# Patient Record
Sex: Female | Born: 1985 | Race: Black or African American | Hispanic: No | Marital: Single | Smoking: Never smoker
Health system: Southern US, Community
[De-identification: ages and names within clinical notes are randomized; demographics above are authoritative.]

## PROBLEM LIST (undated history)

## (undated) ENCOUNTER — Inpatient Hospital Stay (HOSPITAL_COMMUNITY): Payer: Self-pay

## (undated) HISTORY — PX: ABSCESS DRAINAGE: SHX1119

---
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
## 2004-02-28 ENCOUNTER — Encounter: Admission: RE | Admit: 2004-02-28 | Discharge: 2004-02-28 | Payer: Self-pay | Admitting: Family Medicine

## 2004-06-04 ENCOUNTER — Encounter: Admission: RE | Admit: 2004-06-04 | Discharge: 2004-06-04 | Payer: Self-pay | Admitting: Family Medicine

## 2004-08-29 ENCOUNTER — Ambulatory Visit: Payer: Self-pay | Admitting: Family Medicine

## 2004-11-29 ENCOUNTER — Ambulatory Visit: Payer: Self-pay | Admitting: Family Medicine

## 2005-03-26 ENCOUNTER — Ambulatory Visit: Payer: Self-pay | Admitting: Family Medicine

## 2005-04-09 ENCOUNTER — Ambulatory Visit: Payer: Self-pay | Admitting: Sports Medicine

## 2005-05-20 ENCOUNTER — Emergency Department (HOSPITAL_COMMUNITY): Admission: EM | Admit: 2005-05-20 | Discharge: 2005-05-20 | Payer: Self-pay | Admitting: Emergency Medicine

## 2005-05-30 ENCOUNTER — Ambulatory Visit: Payer: Self-pay | Admitting: Sports Medicine

## 2005-06-26 ENCOUNTER — Ambulatory Visit: Payer: Self-pay | Admitting: Family Medicine

## 2006-04-01 ENCOUNTER — Ambulatory Visit: Payer: Self-pay | Admitting: Obstetrics and Gynecology

## 2006-04-01 ENCOUNTER — Inpatient Hospital Stay (HOSPITAL_COMMUNITY): Admission: AD | Admit: 2006-04-01 | Discharge: 2006-04-01 | Payer: Self-pay | Admitting: Family Medicine

## 2006-04-22 ENCOUNTER — Inpatient Hospital Stay (HOSPITAL_COMMUNITY): Admission: AD | Admit: 2006-04-22 | Discharge: 2006-04-22 | Payer: Self-pay | Admitting: Obstetrics and Gynecology

## 2006-04-22 ENCOUNTER — Ambulatory Visit: Payer: Self-pay | Admitting: Obstetrics and Gynecology

## 2006-07-29 ENCOUNTER — Inpatient Hospital Stay (HOSPITAL_COMMUNITY): Admission: AD | Admit: 2006-07-29 | Discharge: 2006-08-01 | Payer: Self-pay | Admitting: Obstetrics

## 2007-06-15 IMAGING — US US OB COMP +14 WK
1 series · 13 of 28 positions shown · non-contrast
Comparison: none

CLINICAL DATA: No prenatal care.

[Series 1: us ob comp +14 wk · 0.29mm/px · 13 of 76 slices shown]
[im 3/76]
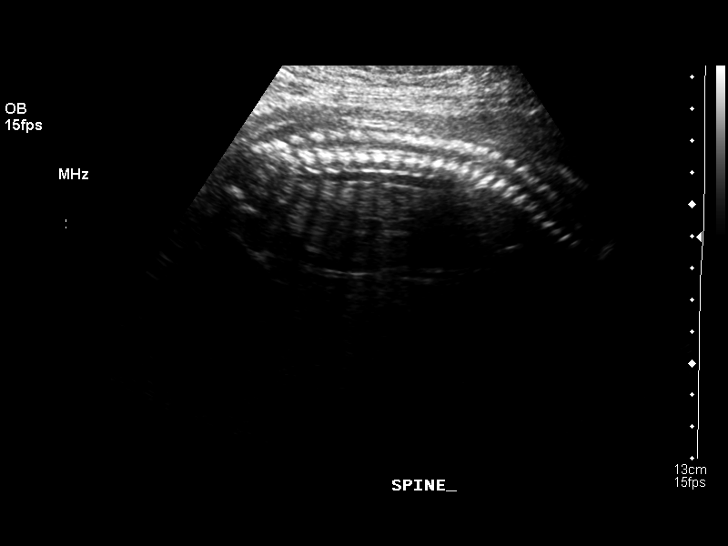
[im 9/76]
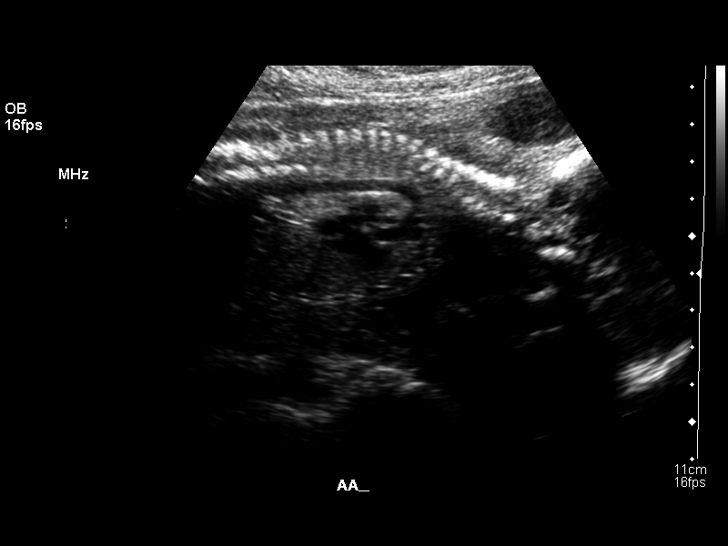
[im 14/76]
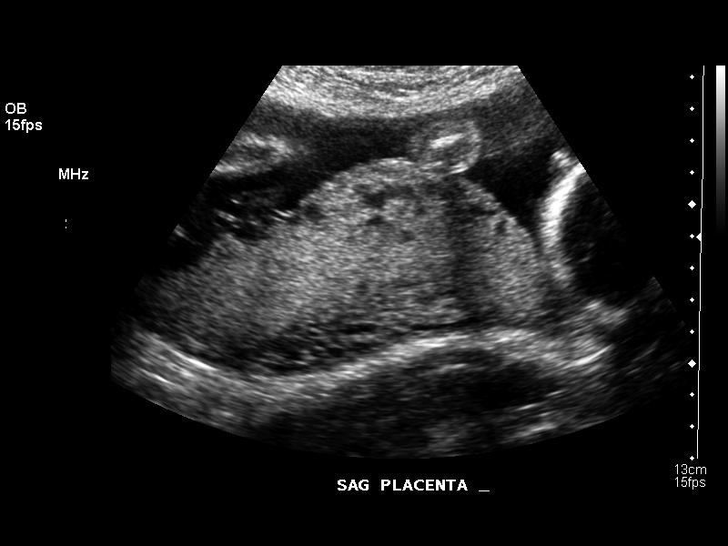
[im 20/76]
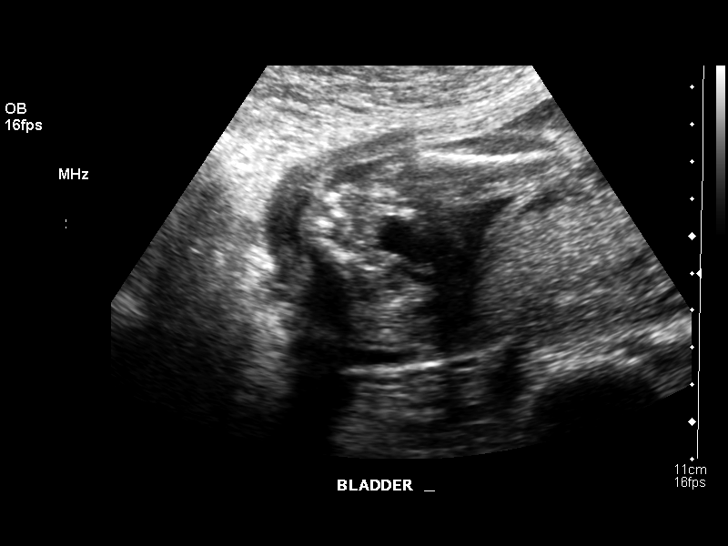
[im 26/76]
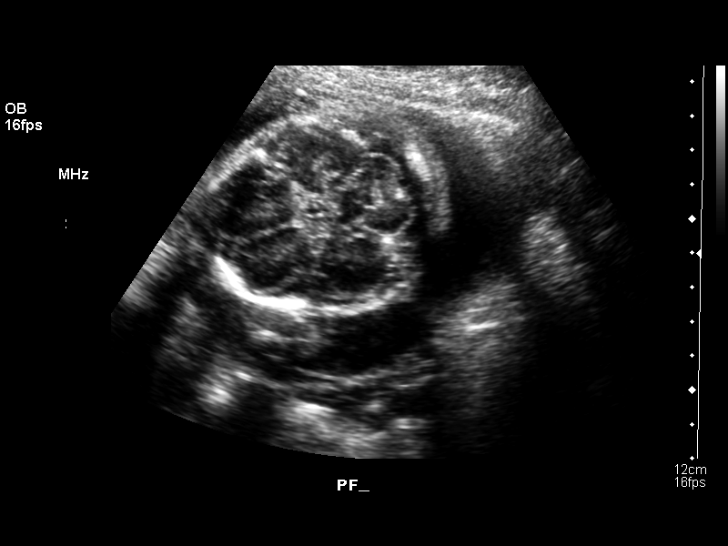
[im 31/76]
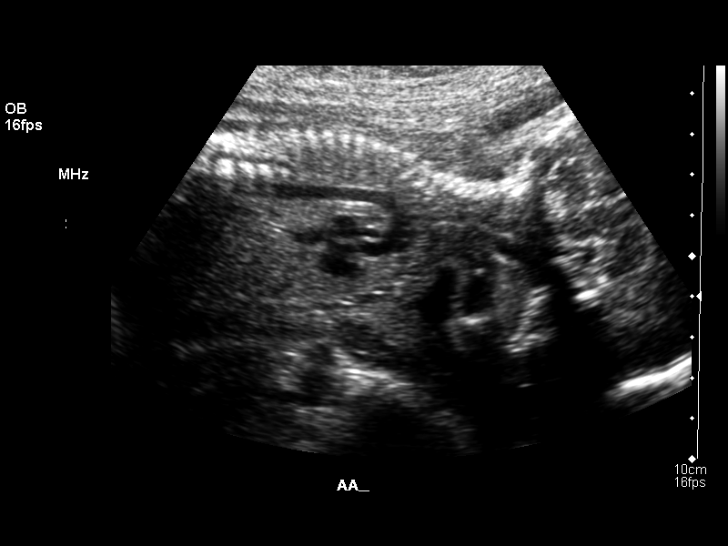
[im 39/76]
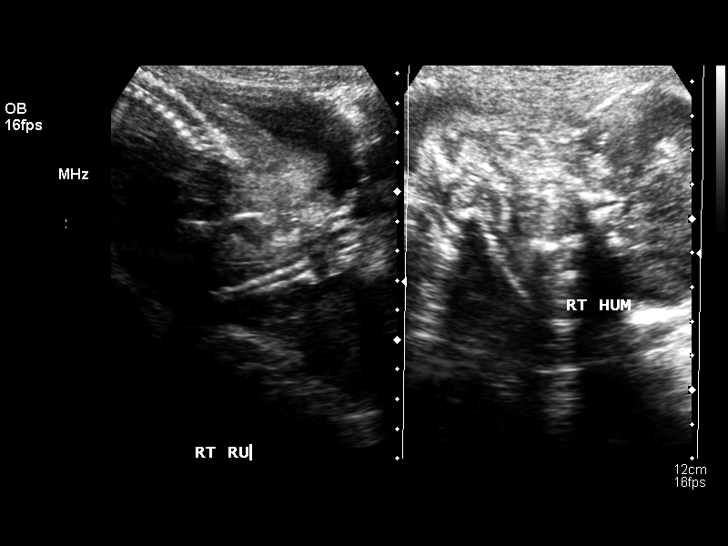
[im 45/76]
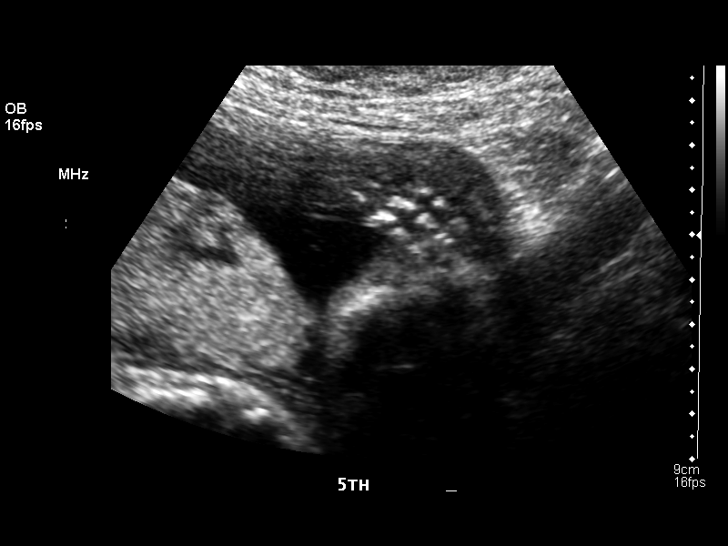
[im 51/76]
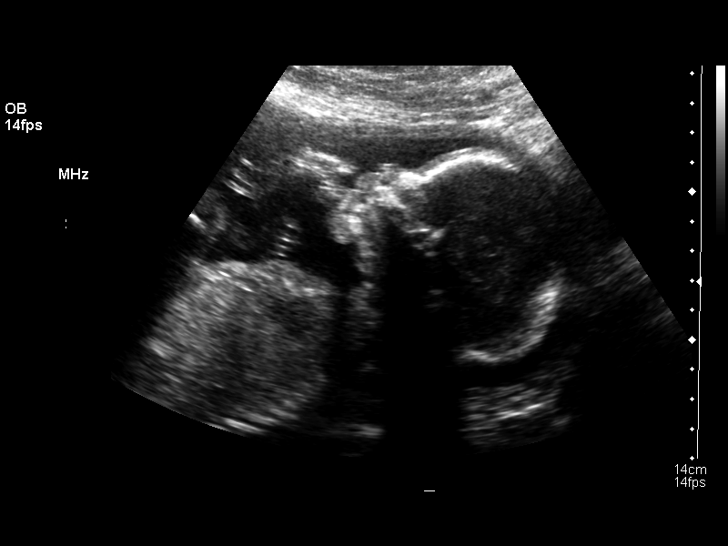
[im 56/76]
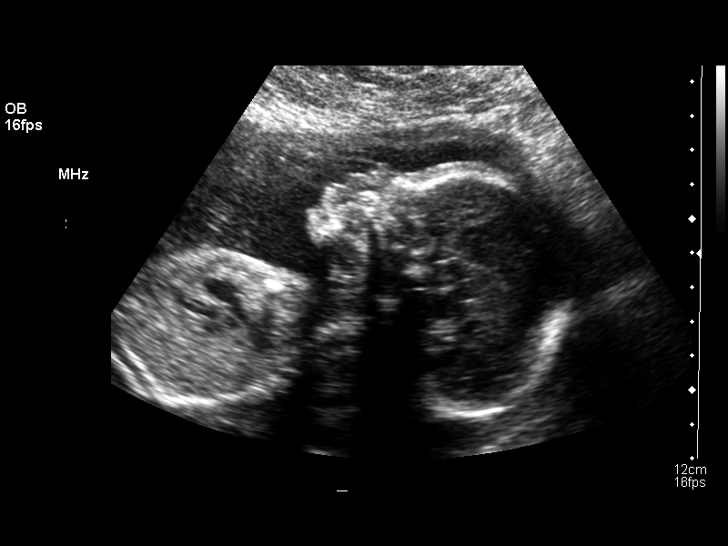
[im 62/76]
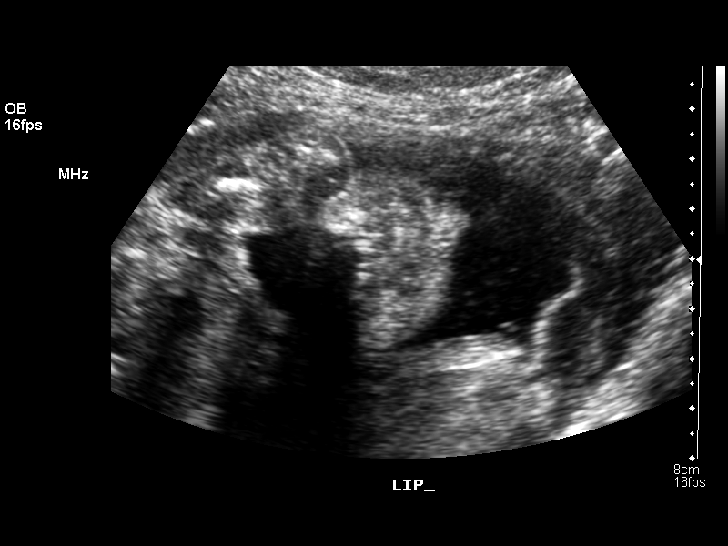
[im 67/76]
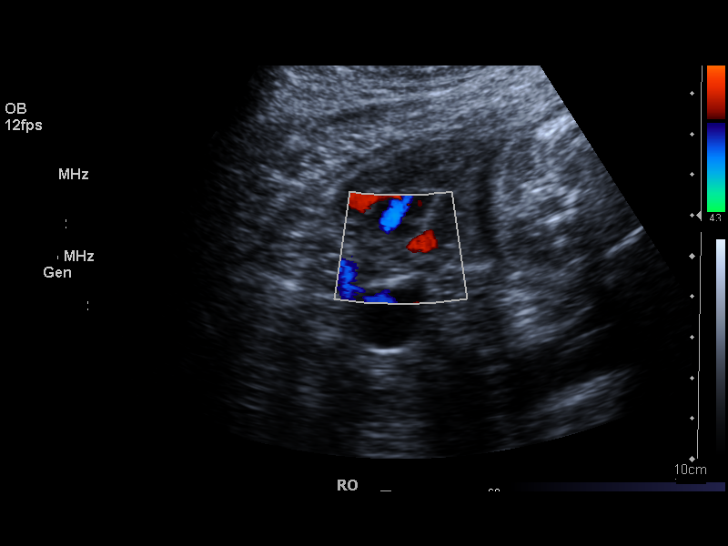
[im 73/76]
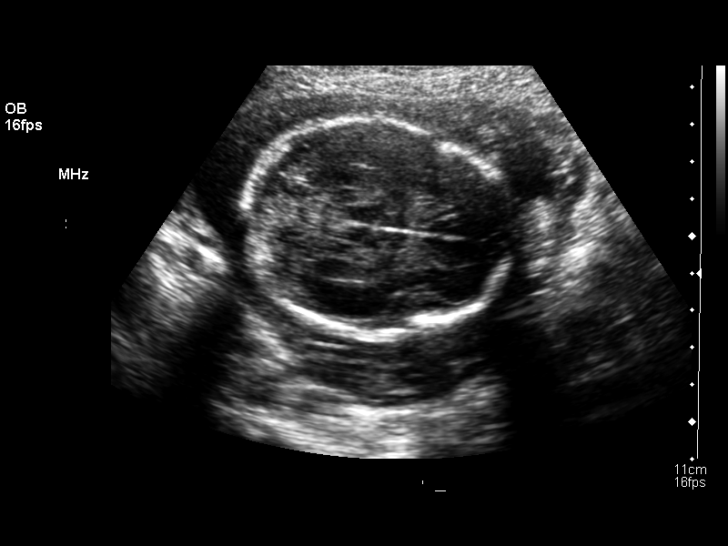

[13 of 28 positions shown; findings below may reference images not displayed]

OBSTETRICAL ULTRASOUND:
 Number of Fetuses: 1
 Heart Rate:  150
 Movement:  Yes
 Breathing:  No  
 Presentation:  Cephalic
 Placental Location:  Posterior
 Grade:  I
 Previa:  No
 Amniotic Fluid (Subjective):  Normal
 Amniotic Fluid (Objective):   4.9 cm Vertical pocket 

 FETAL BIOMETRY
 BPD:   5.8 cm   23 w 5 d
 HC:   22.0 cm   23 w 4 d
 AC:   17.4 cm   22 w 2 d
 FL:   4.1 cm   23 w 2 d

 MEAN GA:  23 w 2 d  US EDC: 07/27/06

 FETAL ANATOMY
 Lateral Ventricles:    Visualized 
 Thalami/CSP:      Visualized 
 Posterior Fossa:  Visualized 
 Nuchal Region:    Not visualized 
 Spine:      Visualized 
 4 Chamber Heart on Left:      Visualized 
 Stomach on Left:      Visualized 
 3 Vessel Cord:    Visualized 
 Cord Insertion site:    Visualized 
 Kidneys:  Visualized 
 Bladder:  Visualized 
 Extremities:      Visualized 

 ADDITIONAL ANATOMY VISUALIZED:  LVOT, RVOT, upper lip, orbits, profile, diaphragm, 5th digit, ductal arch, and aortic arch.  A nasal bone is visualized.

 MATERNAL UTERINE AND ADNEXAL FINDINGS
 Cervix: 3.5 cm Transabdominally.  Normal ovaries.
IMPRESSION: 1.  Single living intrauterine fetus in cephalic presentation with subjectively normal amniotic fluid volume.  Estimated mean gestational age by ultrasound today is 23 weeks 2 days with fairly good concordance of the fetal biometric parameters although the abdominal circumference lags about a week behind the other indicators.   Estimated ultrasound EDC is 07/27/06.
 2.  Visualized fetal anatomy is unremarkable.  
 3.  Follow-up ultrasound in four weeks would be helpful to insure appropriate interval growth.

## 2007-06-20 ENCOUNTER — Emergency Department (HOSPITAL_COMMUNITY): Admission: EM | Admit: 2007-06-20 | Discharge: 2007-06-20 | Payer: Self-pay | Admitting: Emergency Medicine

## 2008-05-01 ENCOUNTER — Inpatient Hospital Stay (HOSPITAL_COMMUNITY): Admission: AD | Admit: 2008-05-01 | Discharge: 2008-05-01 | Payer: Self-pay | Admitting: Gynecology

## 2008-05-22 ENCOUNTER — Emergency Department (HOSPITAL_COMMUNITY): Admission: EM | Admit: 2008-05-22 | Discharge: 2008-05-22 | Payer: Self-pay | Admitting: Emergency Medicine

## 2010-04-03 ENCOUNTER — Ambulatory Visit: Payer: Self-pay | Admitting: Nurse Practitioner

## 2010-04-03 ENCOUNTER — Inpatient Hospital Stay (HOSPITAL_COMMUNITY): Admission: AD | Admit: 2010-04-03 | Discharge: 2010-04-03 | Payer: Self-pay | Admitting: Obstetrics & Gynecology

## 2010-08-07 ENCOUNTER — Inpatient Hospital Stay (HOSPITAL_COMMUNITY): Admission: AD | Admit: 2010-08-07 | Discharge: 2010-08-09 | Payer: Self-pay | Admitting: Obstetrics

## 2011-01-09 LAB — CBC
HCT: 28.9 % — ABNORMAL LOW (ref 36.0–46.0)
Hemoglobin: 11.3 g/dL — ABNORMAL LOW (ref 12.0–15.0)
MCH: 25.6 pg — ABNORMAL LOW (ref 26.0–34.0)
MCHC: 32.4 g/dL (ref 30.0–36.0)
MCV: 78.7 fL (ref 78.0–100.0)
Platelets: 133 10*3/uL — ABNORMAL LOW (ref 150–400)
RDW: 14.7 % (ref 11.5–15.5)
RDW: 14.9 % (ref 11.5–15.5)
WBC: 13.2 10*3/uL — ABNORMAL HIGH (ref 4.0–10.5)

## 2011-01-09 LAB — RPR: RPR Ser Ql: NONREACTIVE

## 2011-01-14 LAB — TYPE AND SCREEN: Antibody Screen: NEGATIVE

## 2011-01-14 LAB — CBC
Hemoglobin: 11.5 g/dL — ABNORMAL LOW (ref 12.0–15.0)
MCHC: 33.4 g/dL (ref 30.0–36.0)
RDW: 13.4 % (ref 11.5–15.5)

## 2011-01-14 LAB — RPR: RPR Ser Ql: NONREACTIVE

## 2011-01-14 LAB — WET PREP, GENITAL
Clue Cells Wet Prep HPF POC: NONE SEEN
Trich, Wet Prep: NONE SEEN
Yeast Wet Prep HPF POC: NONE SEEN

## 2011-01-14 LAB — DIFFERENTIAL
Basophils Absolute: 0.1 10*3/uL (ref 0.0–0.1)
Basophils Relative: 0 % (ref 0–1)
Eosinophils Relative: 2 % (ref 0–5)
Monocytes Absolute: 0.6 10*3/uL (ref 0.1–1.0)
Neutro Abs: 8.3 10*3/uL — ABNORMAL HIGH (ref 1.7–7.7)

## 2011-03-15 NOTE — Discharge Summary (Signed)
Diane Sanders, BALSAM NO.:  [id number]   MEDICAL RECORD NO.:  [id number]                   PATIENT TYPE:  INP   LOCATION:  5711                                 FACILITY:  [facility]   PHYSICIAN:  Asencion Partridge, M.D.                  DATE OF BIRTH:  [birth year]   DATE OF ADMISSION:  05/22/2003  DATE OF DISCHARGE:  05/25/2003                                 DISCHARGE SUMMARY   DISCHARGE DIAGNOSES:  1. Bilateral breast abscesses.  2. Status post incision and drainage of bilateral breast abscesses.  3. Impetigo.   DISCHARGE MEDICATIONS:  1. Colace 100 mg 1 p.o. q.d.  2. Bactroban applied to affected areas t.i.d.  3. Keflex 500 mg 1 p.o. q.i.d. x 10 days.  4. Vicodin 5/500 mg 1-2 tablets p.o. q.4h. p.r.n. pain.   FOLLOW UP:  1. Tamika J. Lazarus Salines, M.D. at [facility] on Friday, May 17, 2003 at 3:20 p.m.  2. Sandria Bales. Ezzard Standing, M.D., at [facility] on Wednesday, June 01, 2003 at 10 a.m.   PROCEDURES:  1. Incision and drainage of bilateral breast abscesses.  2. Depo-Provera shot.   CONSULTATIONS:  Sandria Bales. Ezzard Standing, M.D. of [facility].   HISTORY OF PRESENT ILLNESS:  Upon admission, this 25 year old maybe three  and a half weeks postpartum, who had breast fed her infant for one week  stopped after developing soreness.  She was seen in the [facility] approximately two weeks after developing soreness and was given  Cephalexin for presumed mastitis and Vicodin for pain.  She took only two  doses of antibiotics because they made her itch.  She was using warm  compresses and on Saturday, the day before admission, her left breast began  draining.  So, she came to the emergency department on Sunday.   Dr. Sandria Bales. Ezzard Standing saw the patient in the emergency department and took her  to the emergency department for incision and drainage of her bilateral  breast abscesses.   LABORATORY DATA:  CBC with a  hemoglobin of 11.2, hematocrit 33.9, white  count of 27, platelets of 388,000.  She had 88% neutrophils, 7% lymphocytes,  5% monocytes, 0 eosinophils, and 0 basophils.  Chem-7 showed a sodium of  137, potassium of 3, chloride 96, bicarbonate 32, BUN 3, creatinine 0.6,  glucose is 76.  Calcium of 9.1.  Total bilirubin 0.5, alkaline phosphate  170.  SGOT 20, SGPT 10, total protein 7.9, albumin 2.6.  Culture of  abscesses show gram-positive cocci in clusters.  Blood cultures had no  growth as of discharge.   Upon discharge, her labs were CBC showing a hemoglobin of 10.6, hematocrit  32.7, white count of 8.3, platelets of 453,000.  Chem-7 showed a sodium of  140, potassium 4.3, chloride 107, bicarbonate 28, BUN less than  1,  creatinine 0.6, glucose of 90.  Calcium of 9.1.   HOSPITAL COURSE:  Problem 1:  BILATERAL BREASTS ABSCESS:  She was taken to  the operating room by Dr. Sandria Bales. Newman to perform an incision and  drainage bilaterally.  She was started on IV Ancef.  She was never febrile  but when it became clear that her infection was clearing she was switched to  p.o. antibiotics and she will be continued on these as an outpatient.   Problem 2:  IMPETIGO:  A small rash was noted on her right cheek.  It  crusted over with a yellow crust.  It was felt to be impetigo which will be  covered by the Keflex and she was also given Bactroban to apply to the  infected area.  She is discharged on May 25, 2003 in improved condition.  She will have home health that will schedule home visits to perform dressing  changes.      Ursula Beath, MD                     Asencion Partridge, M.D.    JT/MEDQ  D:  05/25/2003  T:  05/25/2003  Job:  [id number]   cc:   Sandria Bales. Ezzard Standing, M.D.  [address], [address]  [place]  [place] [address]  Fax: [phone]   Thomos Lemons. Lazarus Salines, M.D.  Fam. Med - Resident - [place], [place] [address]  Fax: [phone]

## 2011-03-15 NOTE — Consult Note (Signed)
NAME:  Diane Sanders, Diane Sanders                           ACCOUNT NO.:  [id number]   MEDICAL RECORD NO.:  [id number]                   PATIENT TYPE:  INP   LOCATION:  1828                                 FACILITY:  [facility]   PHYSICIAN:  Sandria Bales. Ezzard Standing, M.D.               DATE OF BIRTH:  11-01-1985   DATE OF CONSULTATION:  05/22/2003  DATE OF DISCHARGE:                                   CONSULTATION   REASON FOR CONSULTATION:  Bilateral breast abscesses.   HISTORY OF PRESENT ILLNESS:  This is an 25 year old black female who is  through the family practice teaching service and Tamika J. Lazarus Salines, M.D., who  she identifies as her primary caregiver.  About three-and-a-half weeks ago,  she had a boy delivered by vaginal delivery at [facility] called  __________.  She tried breastfeeding for about a week, but that really did  not last long.  She developed bilateral breast tender.  Apparently the child  developed Candida.  She took some of the medicine he was taking.  Her  breasts became increasing sore and tender.  She then saw one of the  residents at the family practice service on Thursday, May 19, 2003, and was  given Keflex and Vicodin.  Fortunately she has really only taken like a  couple of tablets of the Keflex.  She was worried about an allergy to Keflex  and did not take the medicine.  She then presented today to [facility] at about 1 p.m. with increasing breast pain.  Apparently she did  not talk to the family practice residents before she came and I was  consulted to see her.   ALLERGIES:  She states that she is allergic to IBUPROFEN.   MEDICATIONS:  Her current medications only include the Keflex and  hydrocodone that she has been on.   SOCIAL HISTORY:  She does have one other child who is almost 42 years old  this September.  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  Her boyfriend is in the room with her while I saw her.   REVIEW OF  SYSTEMS:  PULMONARY:  No history of pneumonia or tuberculosis.  CARDIAC:  No history of heart disease or chest pain.  GASTROINTESTINAL:  No  history of __________ disease or liver disease.  UROLOGIC:  No history of  kidney stones or kidney infections.   PHYSICAL EXAMINATION:  VITAL SIGNS:  Temperature 98.3 degrees, blood  pressure 119/69, pulse 101, respirations 16.  GENERAL APPEARANCE:  She is a well-nourished black female, alert and  cooperative on physical exam.  HEENT:  Unremarkable, although she has a little bit of a rash off the corner  of her right mouth that she is kind of blaming on a possible allergy to  medicine.  I wish that was true.  NECK:  Supple.  She  has no mass, no thyromegaly, and no lymphadenopathy.  LUNGS:  Clear to auscultation.  HEART:  Regular rate and rhythm without murmur or rub.  BREASTS:  She has a draining sinus tract at the 12 o'clock position of the  left breast, which is decompressed.  Her breasts are lumpy.  In her right  breast, she has a bulging area.  It is not really hot, but she had a bulging  over her right breast.   LABORATORY DATA:  She actually has a white blood cell count of 27,000,  hemoglobin 11, and hematocrit 33.   IMPRESSION:  1. Probable bilateral breast infections, although I am not really impressed     with how much swelling she has of the right breast as far as generalized     redness to the skin.  Her left breast clearly has an abscess that is     draining and needs to be open more broadly.  I talked to her and her     boyfriend about being taken to the operating room, cleaning up the left     breast, and probably pumping up the right breast for drainage.  These     will probably have to be locally care of and will take two to six weeks     to heal in.  I will put her on some IV antibiotics until this is all     better.  2. Postpartum three-and-a-half weeks from her second child.  I spoke to     Ursula Beath, M.D., who is on  call for family practice, to follow     her allow with me.  She has really been managed and treated at the family     practice office for the last two weeks.  I am just now seeing her for the     first time.                                               Sandria Bales. Ezzard Standing, M.D.    DHN/MEDQ  D:  05/22/2003  T:  05/22/2003  Job:  [id number]   cc:   Thomos Lemons. Lazarus Salines, M.D.  Fam. Med - Resident - [place], [place] [address]  Fax: [phone]   Ursula Beath, MD

## 2011-03-15 NOTE — Op Note (Signed)
   NAME:  Diane Sanders, Diane Sanders                           ACCOUNT NO.:  [id number]   MEDICAL RECORD NO.:  [id number]                   PATIENT TYPE:  INP   LOCATION:  5711                                 FACILITY:  [facility]   PHYSICIAN:  Sandria Bales. Ezzard Standing, M.D.               DATE OF BIRTH:  [birth year]   DATE OF PROCEDURE:  05/22/2003  DATE OF DISCHARGE:                                 OPERATIVE REPORT   PREOPERATIVE DIAGNOSIS:  Bilateral breast abscesses.   POSTOPERATIVE DIAGNOSIS:  Bilateral breast abscesses.   PROCEDURE:  Incision and drainage of right and left breast abscess.   SURGEON:  Sandria Bales. Ezzard Standing, M.D.   ANESTHESIA:  General.   ESTIMATED BLOOD LOSS:  50 mL.   DRAINS:  __________ packed both wounds open.   PROCEDURE:  [name] is an 25 year old black female who is 3 weeks  postpartum who presents to the ER with bilateral breast abscesses, treated  by the [facility] family practice service.  She had both of these incised  and drained.  I discussed with her the indications and potential  complications, including disfigurement, continued abscess, and pain.   The patient was brought into the operating room.  Both breasts were  ultrasounded.  The right breast __________. It was really sort of hard to  define.  It looked like a very good cavity on the right side.  The left side  was already draining, so I prepped both sides and opened the right side and  probably had over 150 mL of pus in the right breast, destroying most of the  breast.  It has a large cavity, probably 6-7 cm in diameter.  I cleaned this  out with an irrigator with saline, packed with Betadine gauze on the left  side.  I had a smaller abscess, about 3-4 cm, that I packed open with  Betadine gauze also.  This patient will be kept overnight and observed for a  day or two until these are better.                                               Sandria Bales. Ezzard Standing, M.D.    DHN/MEDQ  D:  05/22/2003  T:  05/23/2003  Job:   [id number]   cc:   Tamika J. Lazarus Salines, M.D.  Fam. Med - Resident - [place], [place] [address]  Fax: [phone]   Marlou Porch

## 2011-07-26 LAB — STREP A DNA PROBE

## 2013-10-19 ENCOUNTER — Encounter (HOSPITAL_COMMUNITY): Payer: Self-pay | Admitting: *Deleted

## 2013-10-19 ENCOUNTER — Inpatient Hospital Stay (HOSPITAL_COMMUNITY)
Admission: AD | Admit: 2013-10-19 | Discharge: 2013-10-19 | Disposition: A | Discharge status: Home / Self Care | Payer: Self-pay | Source: Ambulatory Visit | Attending: Obstetrics & Gynecology | Admitting: Obstetrics & Gynecology

## 2013-10-19 ENCOUNTER — Inpatient Hospital Stay (HOSPITAL_COMMUNITY): Payer: Self-pay

## 2013-10-19 DIAGNOSIS — R109 Unspecified abdominal pain: Secondary | ICD-10-CM

## 2013-10-19 DIAGNOSIS — O210 Mild hyperemesis gravidarum: Secondary | ICD-10-CM | POA: Insufficient documentation

## 2013-10-19 DIAGNOSIS — O219 Vomiting of pregnancy, unspecified: Secondary | ICD-10-CM

## 2013-10-19 DIAGNOSIS — O26899 Other specified pregnancy related conditions, unspecified trimester: Secondary | ICD-10-CM

## 2013-10-19 DIAGNOSIS — J069 Acute upper respiratory infection, unspecified: Secondary | ICD-10-CM | POA: Insufficient documentation

## 2013-10-19 DIAGNOSIS — O99891 Other specified diseases and conditions complicating pregnancy: Secondary | ICD-10-CM | POA: Insufficient documentation

## 2013-10-19 LAB — CBC
HCT: 37.7 % (ref 36.0–46.0)
MCH: 26.4 pg (ref 26.0–34.0)
MCV: 78.9 fL (ref 78.0–100.0)
Platelets: 193 10*3/uL (ref 150–400)
RBC: 4.78 MIL/uL (ref 3.87–5.11)
RDW: 13.2 % (ref 11.5–15.5)
WBC: 5.4 10*3/uL (ref 4.0–10.5)

## 2013-10-19 LAB — URINALYSIS, ROUTINE W REFLEX MICROSCOPIC
Ketones, ur: 15 mg/dL — AB
Nitrite: NEGATIVE
Protein, ur: NEGATIVE mg/dL

## 2013-10-19 LAB — WET PREP, GENITAL
Clue Cells Wet Prep HPF POC: NONE SEEN
Trich, Wet Prep: NONE SEEN

## 2013-10-19 LAB — URINE MICROSCOPIC-ADD ON

## 2013-10-19 MED ORDER — PROMETHAZINE HCL 25 MG PO TABS: 25.0000 mg | ORAL_TABLET | Freq: Four times a day (QID) | ORAL | Status: DC | PRN

## 2013-10-19 MED ORDER — ONDANSETRON 8 MG PO TBDP: 8.0000 mg | ORAL_TABLET | Freq: Three times a day (TID) | ORAL | Status: DC | PRN

## 2013-10-19 MED ORDER — DM-GUAIFENESIN ER 30-600 MG PO TB12
1.0000 | ORAL_TABLET | Freq: Once | ORAL | Status: DC
  Filled 2013-10-19: qty 1

## 2013-10-19 NOTE — MAU Note (Signed)
Patient states she has a positive home pregnancy test about 3 weeks ago. Has been having nausea with vomiting in the am, abdominal pain, back pain and cold like symptoms for about 3 weeks. Denies bleeding or vaginal discharge.

## 2013-10-19 NOTE — MAU Provider Note (Signed)
History     CSN: [id number]  Arrival date and time: 10/19/13 1141   First Provider Initiated Contact with Patient 10/19/13 1221      Chief Complaint  Patient presents with  . Possible Pregnancy  . Nausea  . Abdominal Pain  . Back Pain  . Headache  . Cough   HPIpt is [id number] 2 [redacted]w[redacted]d pregnant presenting with nausea and vomiting the mornings when she gets ups, abd cramping and back pain. Pt denies spotting, bleeding or vaginal discharge.  Pt denies constipation or diarrhea. Pt is also having sinus congestion.  Pt has take Alka Seltzer cold.  Rn note: Patient states she has a positive home pregnancy test about 3 weeks ago. Has been having nausea with vomiting in the am, abdominal pain, back pain and cold like symptoms for about 3 weeks. Denies bleeding or vaginal discharge.   History reviewed. No pertinent past medical history.  Past Surgical History  Procedure Laterality Date  . Abscess drainage      bilateral due to clogged milk ducts    History reviewed. No pertinent family history.  History  Substance Use Topics  . Smoking status: Never Smoker   . Smokeless tobacco: Never Used  . Alcohol Use: No    Allergies: Allergies not on file  No prescriptions prior to admission    Review of Systems  Constitutional: Negative for fever and chills.  HENT: Positive for congestion.   Respiratory: Positive for cough. Negative for sputum production and wheezing.   Gastrointestinal: Positive for nausea, vomiting and abdominal pain. Negative for diarrhea and constipation.  Genitourinary: Negative for dysuria and urgency.   Physical Exam   Blood pressure 119/62, pulse 114, temperature 98.6 F (37 C), temperature source Oral, resp. rate 16, height 5\' 3"  (1.6 m), weight 71.94 kg (158 lb 9.6 oz), last menstrual period 08/16/2013, SpO2 100.00%.  Physical Exam  Nursing note and vitals reviewed. Constitutional: She is oriented to person, place, and time. She appears well-developed  and well-nourished. No distress.  HENT:  Head: Normocephalic.  Eyes: Pupils are equal, round, and reactive to light.  Neck: Normal range of motion. Neck supple.  Cardiovascular: Normal rate.   Respiratory: Effort normal.  GI: Soft. She exhibits no distension. There is no tenderness.  Genitourinary: Vagina normal and uterus normal.  Uterus 8-10- weeks size, NT;adnexa without palpable enlargement or tenderness  Musculoskeletal: Normal range of motion.  Neurological: She is alert and oriented to person, place, and time.  Skin: Skin is warm and dry.  Psychiatric: She has a normal mood and affect.    MAU Course  Procedures Results for orders placed during the hospital encounter of 10/19/13 (from the past 24 hour(s))  URINALYSIS, ROUTINE W REFLEX MICROSCOPIC     Status: Abnormal   Collection Time    10/19/13 12:05 PM      Result Value Range   Color, Urine YELLOW  YELLOW   APPearance CLEAR  CLEAR   Specific Gravity, Urine >1.030 (*) 1.005 - 1.030   pH 6.0  5.0 - 8.0   Glucose, UA NEGATIVE  NEGATIVE mg/dL   Hgb urine dipstick MODERATE (*) NEGATIVE   Bilirubin Urine NEGATIVE  NEGATIVE   Ketones, ur 15 (*) NEGATIVE mg/dL   Protein, ur NEGATIVE  NEGATIVE mg/dL   Urobilinogen, UA 1.0  0.0 - 1.0 mg/dL   Nitrite NEGATIVE  NEGATIVE   Leukocytes, UA SMALL (*) NEGATIVE  URINE MICROSCOPIC-ADD ON     Status: Abnormal   Collection Time  10/19/13 12:05 PM      Result Value Range   Squamous Epithelial / LPF MANY (*) RARE   WBC, UA 3-6  <3 WBC/hpf   RBC / HPF 0-2  <3 RBC/hpf   Urine-Other MUCOUS PRESENT    POCT PREGNANCY, URINE     Status: Abnormal   Collection Time    10/19/13 12:15 PM      Result Value Range   Preg Test, Ur POSITIVE (*) NEGATIVE  CBC     Status: None   Collection Time    10/19/13 12:50 PM      Result Value Range   WBC 5.4  4.0 - 10.5 K/uL   RBC 4.78  3.87 - 5.11 MIL/uL   Hemoglobin 12.6  12.0 - 15.0 g/dL   HCT 47.8  29.5 - 62.1 %   MCV 78.9  78.0 - 100.0 fL    MCH 26.4  26.0 - 34.0 pg   MCHC 33.4  30.0 - 36.0 g/dL   RDW 30.8  65.7 - 84.6 %   Platelets 193  150 - 400 K/uL  HCG, QUANTITATIVE, PREGNANCY     Status: Abnormal   Collection Time    10/19/13 12:50 PM      Result Value Range   hCG, Beta Chain, Quant, [place] [address] (*) <5 mIU/mL  WET PREP, GENITAL     Status: Abnormal   Collection Time    10/19/13  1:12 PM      Result Value Range   Yeast Wet Prep HPF POC NONE SEEN  NONE SEEN   Trich, Wet Prep NONE SEEN  NONE SEEN   Clue Cells Wet Prep HPF POC NONE SEEN  NONE SEEN   WBC, Wet Prep HPF POC MANY (*) NONE SEEN  US Ob Comp Less 14 Wks  10/19/2013   CLINICAL DATA:  Cramping.  EXAM: OBSTETRIC <14 WK ULTRASOUND  TECHNIQUE: Transabdominal ultrasound was performed for evaluation of the gestation as well as the maternal uterus and adnexal regions.  COMPARISON:  None.  FINDINGS: Intrauterine gestational sac: Visualized/normal in shape.  Yolk sac:  Visualized  Embryo:  Visualized  Cardiac Activity: Visualized  Heart Rate: 170 bpm  MSD: 51.4  mm   11 w   1  d  CRL:   39.6  mm   10 w 6 d                  [place] EDC: 05/11/2014  Maternal uterus/adnexae: Small subchorionic hemorrhage. Ovaries are symmetric in size and echotexture. Small right corpus luteal cyst. No free fluid.  IMPRESSION: Ten week 6 day intrauterine pregnancy with fetal heart rate 170 beats per min. There appears to be a small subchorionic hemorrhage at the fundus.   Electronically Signed   By: Charlett Nose M.D.   On: 10/19/2013 15:01    Assessment and Plan  abd pain in pregnancy Viable single IUP [redacted]w[redacted]d Morning sickness - Rx phenergan/Zofran URI- Mucinex DM and OTC symptomatic relief  Idrissa Beville 10/19/2013, 12:22 PM

## 2013-10-20 LAB — GC/CHLAMYDIA PROBE AMP
CT Probe RNA: NEGATIVE
GC Probe RNA: NEGATIVE

## 2013-10-20 NOTE — MAU Provider Note (Signed)
Attestation of Attending Supervision of Advanced Practitioner (PA/CNM/NP): Evaluation and management procedures were performed by the Advanced Practitioner under my supervision and collaboration.  I have reviewed the Advanced Practitioner's note and chart, and I agree with the management and plan.  Rayna Brenner, MD, FACOG Attending Obstetrician & Gynecologist Faculty Practice, [facility]  

## 2014-08-24 ENCOUNTER — Encounter (HOSPITAL_COMMUNITY): Payer: Self-pay | Admitting: *Deleted

## 2014-08-29 ENCOUNTER — Encounter (HOSPITAL_COMMUNITY): Payer: Self-pay | Admitting: *Deleted

## 2015-01-02 IMAGING — US US OB COMP LESS 14 WK
1 series · 14 of 28 positions shown · non-contrast
Comparison: None.

CLINICAL DATA: Cramping.

EXAM:
OBSTETRIC <14 WK ULTRASOUND
TECHNIQUE: Transabdominal ultrasound was performed for evaluation of the
gestation as well as the maternal uterus and adnexal regions.

[Series 1: us ob comp less 14 wks · 14 of 34 slices shown]
[im 2/34]
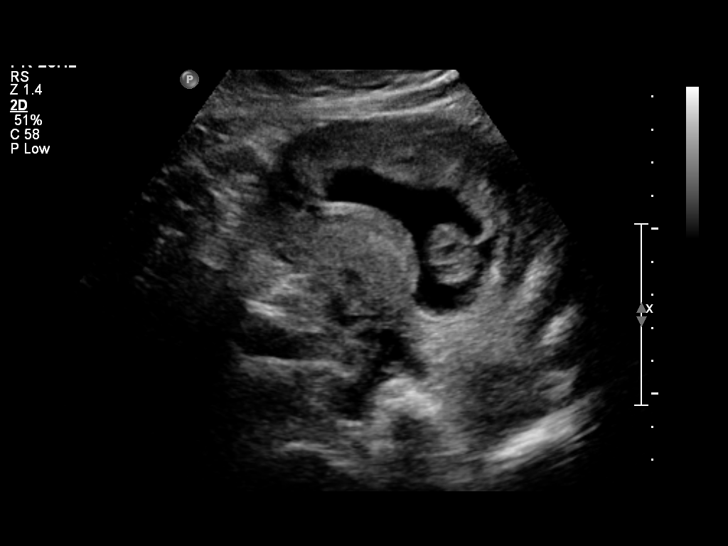
[im 4/34]
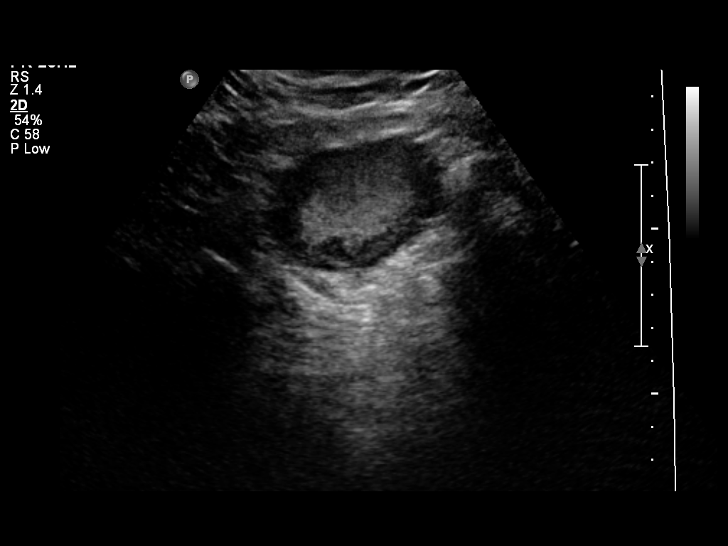
[im 7/34]
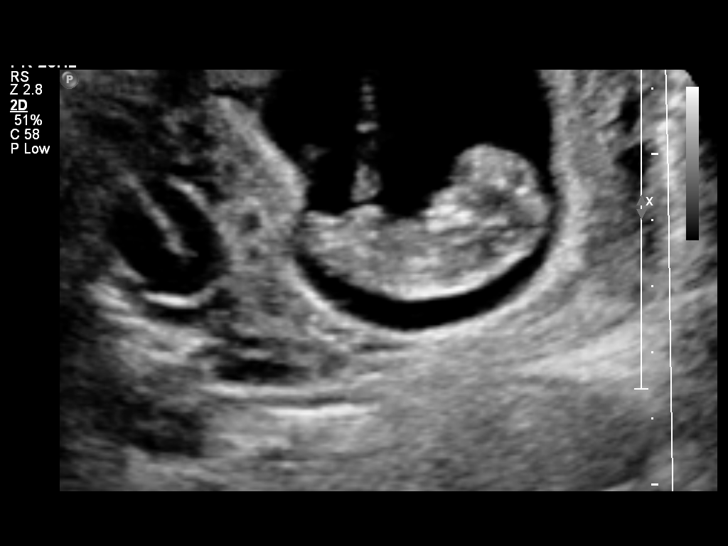
[im 9/34]
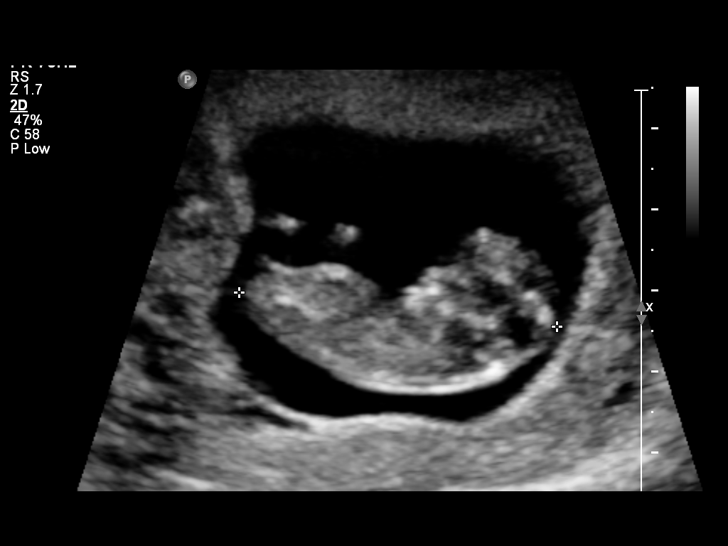
[im 12/34]
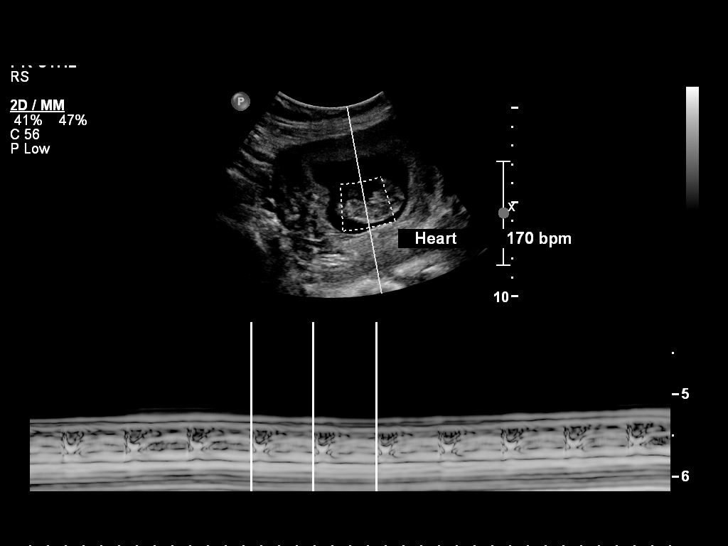
[im 14/34]
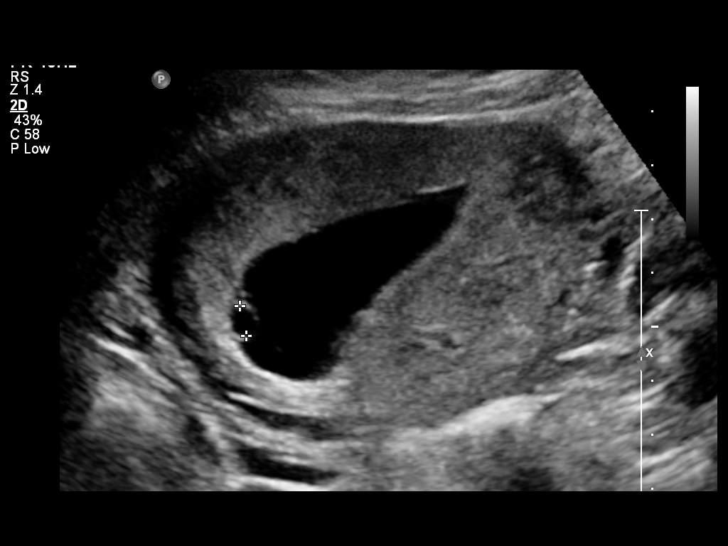
[im 16/34]
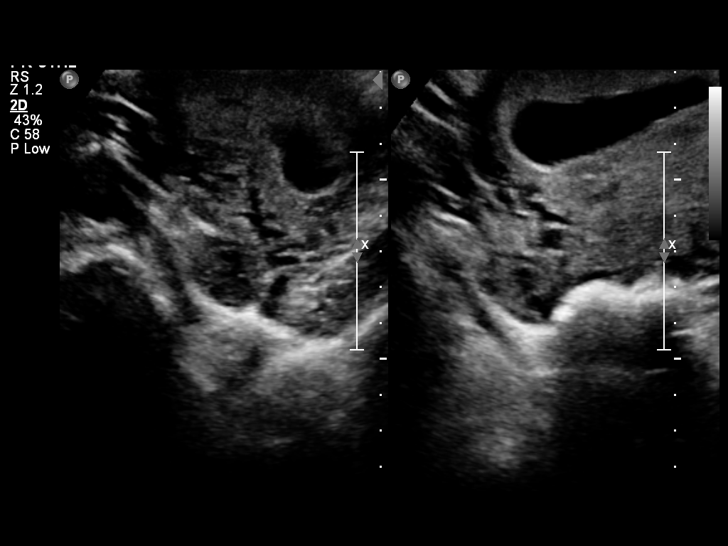
[im 19/34]
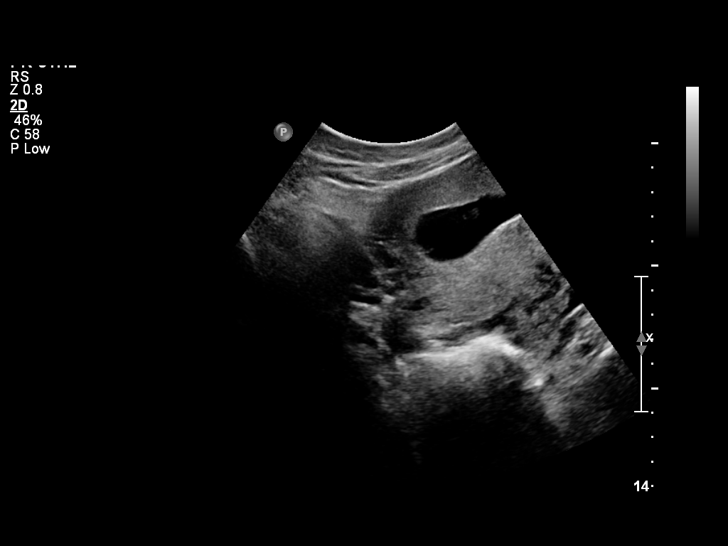
[im 21/34]
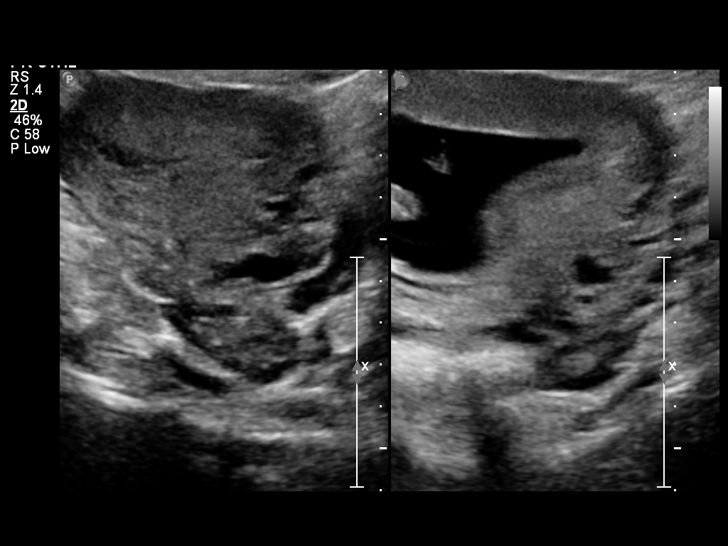
[im 24/34]
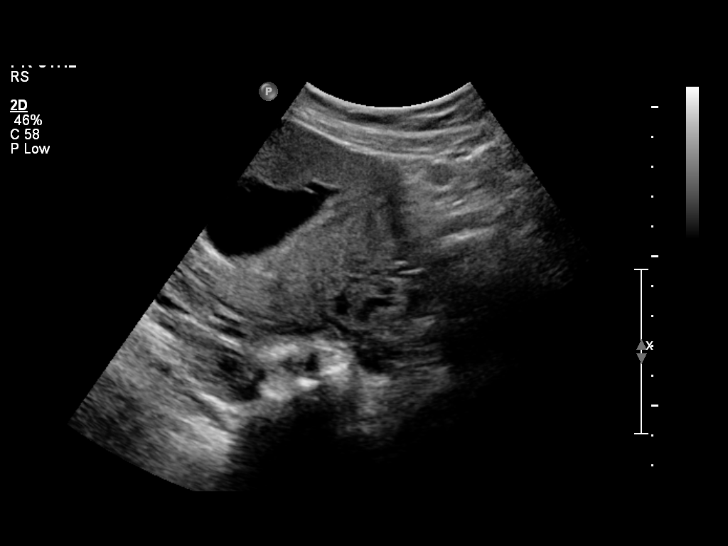
[im 26/34]
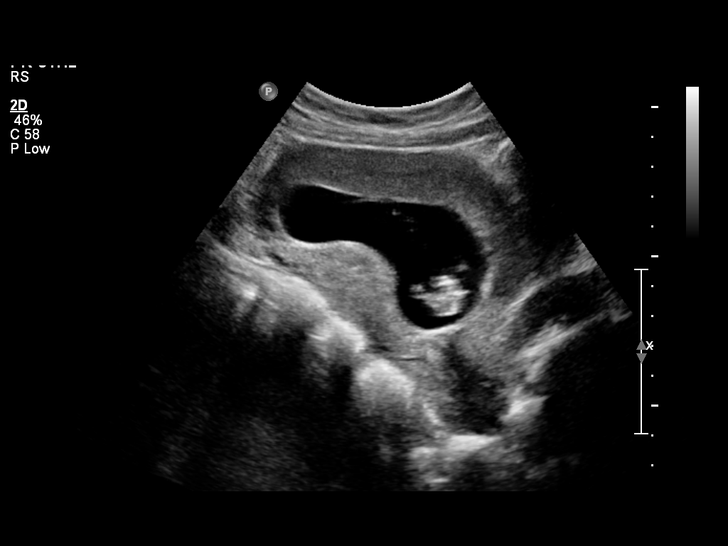
[im 29/34]
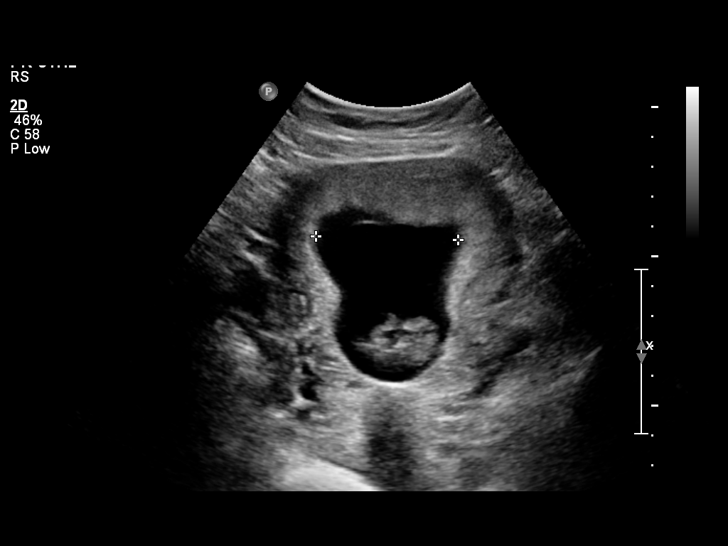
[im 31/34]
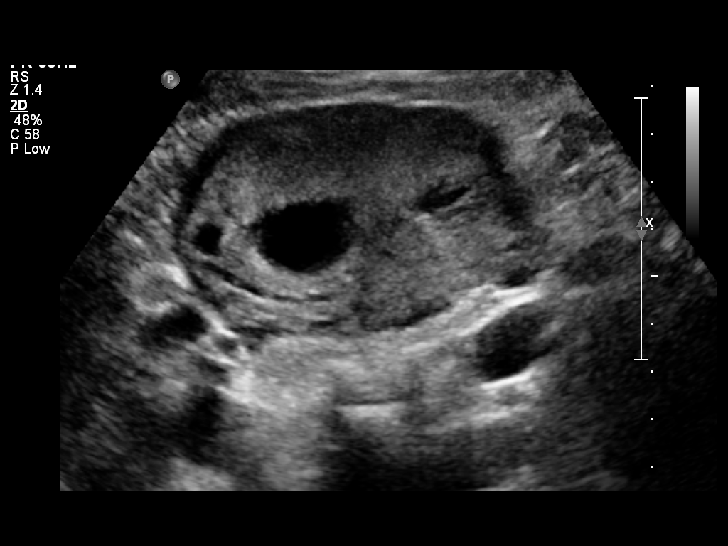
[im 34/34]
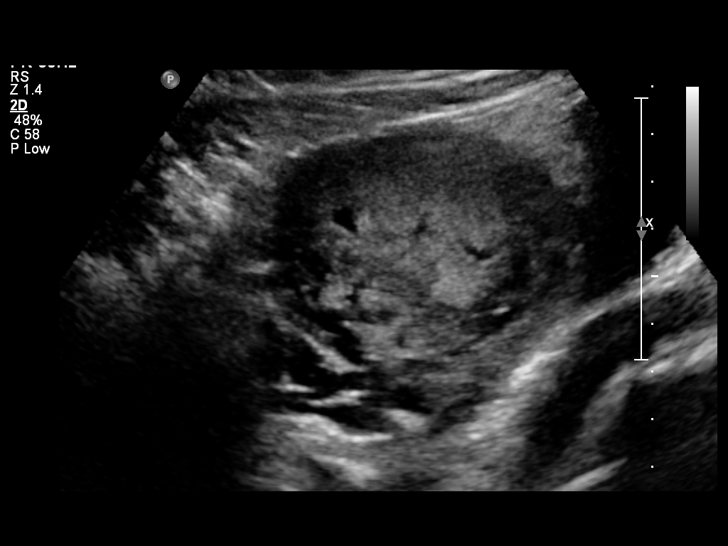

[14 of 28 positions shown; findings below may reference images not displayed]

FINDINGS: Intrauterine gestational sac: Visualized/normal in shape.

Yolk sac:  Visualized

Embryo:  Visualized

Cardiac Activity: Visualized

Heart Rate: 170 bpm

MSD: 51.4  mm   11 w   1  d

CRL:   39.6  mm   10 w 6 d                  US EDC: 05/11/2014

Maternal uterus/adnexae: Small subchorionic hemorrhage. Ovaries are
symmetric in size and echotexture. Small right corpus luteal cyst.
No free fluid.
IMPRESSION: Ten week 6 day intrauterine pregnancy with fetal heart rate 170
beats per min. There appears to be a small subchorionic hemorrhage
at the fundus.

## 2016-07-16 LAB — LAB REPORT - SCANNED: Pap: NEGATIVE

## 2018-01-22 ENCOUNTER — Encounter (HOSPITAL_COMMUNITY): Payer: Self-pay | Admitting: Emergency Medicine

## 2018-01-22 ENCOUNTER — Other Ambulatory Visit: Payer: Self-pay

## 2018-01-22 ENCOUNTER — Ambulatory Visit (HOSPITAL_COMMUNITY)
Admission: EM | Admit: 2018-01-22 | Discharge: 2018-01-22 | Disposition: A | Discharge status: Home / Self Care | Payer: 59 | Attending: Internal Medicine | Admitting: Internal Medicine

## 2018-01-22 DIAGNOSIS — J020 Streptococcal pharyngitis: Secondary | ICD-10-CM | POA: Diagnosis not present

## 2018-01-22 LAB — POCT RAPID STREP A: Streptococcus, Group A Screen (Direct): POSITIVE — AB

## 2018-01-22 MED ORDER — AMOXICILLIN 500 MG PO CAPS: 500.0000 mg | ORAL_CAPSULE | Freq: Two times a day (BID) | ORAL | 0 refills | Status: AC

## 2018-01-22 NOTE — ED Triage Notes (Signed)
Sore throat started on Sunday.  Denies cough, runny nose.

## 2018-01-22 NOTE — ED Provider Notes (Signed)
[facility]    CSN: [id number] Arrival date & time: 01/22/18  1340     History   Chief Complaint Chief Complaint  Patient presents with  . Sore Throat    HPI Diane Sanders is a 32 y.o. female.   Diane Sanders presents with complaints of sore throat which is worsening. Started 3/24. No known fevers. Headache. Without rash. Denies gi/gu complaints. Without cough or congestion. States daughter has had frequent strep. Took Excedrin this morning which did help with pain. Without contributing medical history.   ROS per HPI.      History reviewed. No pertinent past medical history.  There are no active problems to display for this patient.   Past Surgical History:  Procedure Laterality Date  . ABSCESS DRAINAGE     bilateral due to clogged milk ducts    OB History    Gravida  8   Para  4   Term  3   Preterm  1   AB  3   Living  4     SAB  1   TAB  2   Ectopic      Multiple      Live Births               Home Medications    Prior to Admission medications   Medication Sig Start Date End Date Taking? Authorizing Provider  amoxicillin (AMOXIL) 500 MG capsule Take 1 capsule (500 mg total) by mouth 2 (two) times daily for 10 days. 01/22/18 02/01/18  Georgetta Haber, NP  ondansetron (ZOFRAN ODT) 8 MG disintegrating tablet Take 1 tablet (8 mg total) by mouth every 8 (eight) hours as needed for nausea or vomiting. 10/19/13   Jean Rosenthal, NP  promethazine (PHENERGAN) 25 MG tablet Take 1 tablet (25 mg total) by mouth every 6 (six) hours as needed for nausea or vomiting. 10/19/13   Jean Rosenthal, NP    Family History Family History  Problem Relation Age of Onset  . Healthy Mother     Social History Social History   Tobacco Use  . Smoking status: Never Smoker  . Smokeless tobacco: Never Used  Substance Use Topics  . Alcohol use: No  . Drug use: No     Allergies   Ibuprofen   Review of Systems Review of Systems   Physical  Exam Triage Vital Signs ED Triage Vitals  Enc Vitals Group     BP 01/22/18 1431 (!) 97/53     Pulse Rate 01/22/18 1431 73     Resp 01/22/18 1431 18     Temp 01/22/18 1431 98.5 F (36.9 C)     Temp Source 01/22/18 1431 Oral     SpO2 01/22/18 1431 100 %     Weight --      Height --      Head Circumference --      Peak Flow --      Pain Score 01/22/18 1429 7     Pain Loc --      Pain Edu? --      Excl. in GC? --    No data found.  Updated Vital Signs BP (!) 97/53 (BP Location: Left Arm)   Pulse 73   Temp 98.5 F (36.9 C) (Oral)   Resp 18   LMP 01/22/2018   SpO2 100%   Visual Acuity Right Eye Distance:   Left Eye Distance:   Bilateral Distance:    Right Eye Near:  Left Eye Near:    Bilateral Near:     Physical Exam  Constitutional: She is oriented to person, place, and time. She appears well-developed and well-nourished. No distress.  HENT:  Head: Normocephalic and atraumatic.  Right Ear: External ear and ear canal normal. Tympanic membrane is erythematous.  Left Ear: Tympanic membrane, external ear and ear canal normal.  Nose: Nose normal.  Mouth/Throat: Uvula is midline, oropharynx is clear and moist and mucous membranes are normal. Tonsils are 2+ on the right. Tonsils are 2+ on the left. Tonsillar exudate.  Eyes: Pupils are equal, round, and reactive to light. Conjunctivae and EOM are normal.  Cardiovascular: Normal rate, regular rhythm and normal heart sounds.  Pulmonary/Chest: Effort normal and breath sounds normal.  Neurological: She is alert and oriented to person, place, and time.  Skin: Skin is warm and dry.     UC Treatments / Results  Labs (all labs ordered are listed, but only abnormal results are displayed) Labs Reviewed  POCT RAPID STREP A - Abnormal; Notable for the following components:      Result Value   Streptococcus, Group A Screen (Direct) POSITIVE (*)    All other components within normal limits    EKG None Radiology No results  found.  Procedures Procedures (including critical care time)  Medications Ordered in UC Medications - No data to display   Initial Impression / Assessment and Plan / UC Course  I have reviewed the triage vital signs and the nursing notes.  Pertinent labs & imaging results that were available during my care of the patient were reviewed by me and considered in my medical decision making (see chart for details).     Positive rapid strep. Complete course of antibiotics.  Return precautions provided. Patient verbalized understanding and agreeable to plan.    Final Clinical Impressions(s) / UC Diagnoses   Final diagnoses:  Strep pharyngitis    ED Discharge Orders        Ordered    amoxicillin (AMOXIL) 500 MG capsule  2 times daily     01/22/18 1508       Controlled Substance Prescriptions [place] Controlled Substance Registry consulted? Not Applicable   Georgetta HaberBurky, Tiyah Zelenak B, NP 01/22/18 1513

## 2018-01-22 NOTE — Discharge Instructions (Signed)
Tylenol and/or ibuprofen as needed for pain or fevers.  Complete course of antibiotics.  Considered contagious for 24 hours after antibiotics started. Change out toothbrush in 24 hours.

## 2018-09-28 ENCOUNTER — Emergency Department (HOSPITAL_COMMUNITY)
Admission: EM | Admit: 2018-09-28 | Discharge: 2018-09-28 | Disposition: A | Discharge status: Home / Self Care | Payer: 59 | Attending: Emergency Medicine | Admitting: Emergency Medicine

## 2018-09-28 ENCOUNTER — Encounter (HOSPITAL_COMMUNITY): Payer: Self-pay

## 2018-09-28 DIAGNOSIS — J069 Acute upper respiratory infection, unspecified: Secondary | ICD-10-CM | POA: Diagnosis not present

## 2018-09-28 DIAGNOSIS — R509 Fever, unspecified: Secondary | ICD-10-CM | POA: Diagnosis present

## 2018-09-28 DIAGNOSIS — B9789 Other viral agents as the cause of diseases classified elsewhere: Secondary | ICD-10-CM

## 2018-09-28 MED ORDER — BENZONATATE 100 MG PO CAPS: 100.0000 mg | ORAL_CAPSULE | Freq: Three times a day (TID) | ORAL | 0 refills | Status: DC

## 2018-09-28 MED ORDER — SALINE SPRAY 0.65 % NA SOLN: 1.0000 | NASAL | 0 refills | Status: DC | PRN

## 2018-09-28 MED ORDER — ACETAMINOPHEN 325 MG PO TABS
650.0000 mg | ORAL_TABLET | Freq: Once | ORAL | Status: AC
  Administered 2018-09-28: 650 mg via ORAL
  Filled 2018-09-28: qty 2

## 2018-09-28 MED ORDER — ACETAMINOPHEN 325 MG PO TABS: 325.0000 mg | ORAL_TABLET | Freq: Four times a day (QID) | ORAL | 0 refills | Status: AC | PRN

## 2018-09-28 NOTE — ED Provider Notes (Signed)
[facility] EM[facility]GENCY [facility] Provider Note   CSN: [id number] Arrival date & time: 09/28/18  1049     History   Chief Complaint Chief Complaint  Patient presents with  . URI    HPI Florene RouteSade Q Kobashigawa is a 32 y.o. female presenting with fever, congestion, rhinorrhea, body aches, fever, and a productive cough onset 1 week ago. Patient states she has tried zyrtec, theraflu, and honey/lemon without relief. Patient reports her boyfriend's daughter and her grandson have been sick with similar symptoms. Patient states she has not taken any medicine today. Patient states symptoms are worse at night and better with rest. Patient denies sore throat, shortness of breath, or chest pain. Patient denies nausea, vomiting, or abdominal pain.   HPI  History reviewed. No pertinent past medical history.  There are no active problems to display for this patient.   Past Surgical History:  Procedure Laterality Date  . ABSCESS DRAINAGE     bilateral due to clogged milk ducts     OB History    Gravida  8   Para  4   Term  3   Preterm  1   AB  3   Living  4     SAB  1   TAB  2   Ectopic      Multiple      Live Births               Home Medications    Prior to Admission medications   Medication Sig Start Date End Date Taking? Authorizing Provider  acetaminophen (TYLENOL) 325 MG tablet Take 1 tablet (325 mg total) by mouth every 6 (six) hours as needed for up to 7 days. 09/28/18 10/05/18  Carlyle BasquesHernandez, Juliauna Stueve P, PA-C  benzonatate (TESSALON) 100 MG capsule Take 1 capsule (100 mg total) by mouth every 8 (eight) hours. 09/28/18   Carlyle BasquesHernandez, Riyana Biel P, PA-C  ondansetron (ZOFRAN ODT) 8 MG disintegrating tablet Take 1 tablet (8 mg total) by mouth every 8 (eight) hours as needed for nausea or vomiting. 10/19/13   Jean RosenthalLineberry, Susan P, NP  promethazine (PHENERGAN) 25 MG tablet Take 1 tablet (25 mg total) by mouth every 6 (six) hours as needed for nausea or vomiting. 10/19/13    Jean RosenthalLineberry, Susan P, NP  sodium chloride (OCEAN) 0.65 % SOLN nasal spray Place 1 spray into both nostrils as needed for congestion. 09/28/18   Leretha DykesHernandez, Pegeen Stiger P, PA-C    Family History Family History  Problem Relation Age of Onset  . Healthy Mother     Social History Social History   Tobacco Use  . Smoking status: Never Smoker  . Smokeless tobacco: Never Used  Substance Use Topics  . Alcohol use: No  . Drug use: No     Allergies   Ibuprofen   Review of Systems Review of Systems  Constitutional: Positive for fever. Negative for activity change, appetite change and fatigue.  HENT: Positive for congestion and rhinorrhea. Negative for ear pain, postnasal drip and sore throat.   Eyes: Negative for pain, redness and itching.  Respiratory: Negative for cough and shortness of breath.   Cardiovascular: Negative for chest pain.  Gastrointestinal: Negative for abdominal pain, diarrhea, nausea and vomiting.  Musculoskeletal: Positive for myalgias.  Skin: Negative for rash.  Allergic/Immunologic: Negative for environmental allergies and immunocompromised state.  Neurological: Negative for dizziness, weakness and headaches.     Physical Exam Updated Vital Signs BP 113/86 (BP Location: Right Arm)   Pulse Marland Kitchen(!)  114   Temp 99.2 F (37.3 C) (Oral)   Resp 16   LMP 09/06/2018   SpO2 98%   Physical Exam  Constitutional: She appears well-developed and well-nourished. No distress.  HENT:  Head: Normocephalic and atraumatic.  Right Ear: Tympanic membrane, external ear and ear canal normal.  Left Ear: Tympanic membrane, external ear and ear canal normal.  Nose: Mucosal edema and rhinorrhea present.  Mouth/Throat: Uvula is midline, oropharynx is clear and moist and mucous membranes are normal. No oropharyngeal exudate, posterior oropharyngeal edema or posterior oropharyngeal erythema.  Neck: Normal range of motion. Neck supple.  Cardiovascular: Regular rhythm and normal heart sounds.  Tachycardia present. Exam reveals no gallop and no friction rub.  No murmur heard. Pulmonary/Chest: Effort normal and breath sounds normal. No respiratory distress. She has no wheezes. She has no rales.  Musculoskeletal: Normal range of motion.  Neurological: She is alert.  Skin: No rash noted. She is not diaphoretic. No erythema.  Psychiatric: She has a normal mood and affect.  Nursing note and vitals reviewed.    ED Treatments / Results  Labs (all labs ordered are listed, but only abnormal results are displayed) Labs Reviewed - No data to display  EKG None  Radiology No results found.  Procedures Procedures (including critical care time)  Medications Ordered in ED Medications  acetaminophen (TYLENOL) tablet 650 mg (650 mg Oral Given 09/28/18 1149)     Initial Impression / Assessment and Plan / ED Course  I have reviewed the triage vital signs and the nursing notes.  Pertinent labs & imaging results that were available during my care of the patient were reviewed by me and considered in my medical decision making (see chart for details).    Patients symptoms are consistent with URI, likely viral etiology. Discussed that antibiotics are not indicated for viral infections. Pt will be discharged with symptomatic treatment. Prescribed tessalon for cough, tylenol for fever/body aches, and nasal saline spray for congestion. Discussed returning to ER for new or worsening symptoms. Advised patient to follow up with PCP in 3 days. Verbalizes understanding and is agreeable with plan. Pt is hemodynamically stable & in NAD prior to dc.   Final Clinical Impressions(s) / ED Diagnoses   Final diagnoses:  Viral URI with cough    ED Discharge Orders         Ordered    benzonatate (TESSALON) 100 MG capsule  Every 8 hours     09/28/18 1212    sodium chloride (OCEAN) 0.65 % SOLN nasal spray  As needed     09/28/18 1212    acetaminophen (TYLENOL) 325 MG tablet  Every 6 hours PRN      09/28/18 1212           Leretha Dykes, PA-C 09/28/18 1216    Gwyneth Sprout, MD 09/29/18 [phone]

## 2018-09-28 NOTE — Discharge Instructions (Signed)
You have been seen today for cough, congestion, and fever. Please read and follow all provided instructions.   1. Medications: tessalon for cough, nasal saline spray for congestion, tylenol for fever/body aches, usual home medications 2. Treatment: rest, drink plenty of fluids 3. Follow Up: Please follow up with your primary doctor in 3 days for discussion of your diagnoses and further evaluation after today's visit; if you do not have a primary care doctor use the resource guide provided to find one; Please return to the ER for any new or worsening symptoms.   Take medications as prescribed. Return to the emergency room for worsening condition or new concerning symptoms. Follow up with your regular doctor. If you don't have a regular doctor use one of the numbers below to establish a primary care doctor.   Emergency Department Resource Guide 1) Find a Doctor and Pay Out of Pocket Although you won't have to find out who is covered by your insurance plan, it is a good idea to ask around and get recommendations. You will then need to call the office and see if the doctor you have chosen will accept you as a new patient and what types of options they offer for patients who are self-pay. Some doctors offer discounts or will set up payment plans for their patients who do not have insurance, but you will need to ask so you aren't surprised when you get to your appointment.  2) Contact Your Local Health Department Not all health departments have doctors that can see patients for sick visits, but many do, so it is worth a call to see if yours does. If you don't know where your local health department is, you can check in your phone book. The CDC also has a tool to help you locate your state's health department, and many state websites also have listings of all of their local health departments.  3) Find a Walk-in Clinic If your illness is not likely to be very severe or complicated, you may want to try a  walk in clinic. These are popping up all over the country in pharmacies, drugstores, and shopping centers. They're usually staffed by nurse practitioners or physician assistants that have been trained to treat common illnesses and complaints. They're usually fairly quick and inexpensive. However, if you have serious medical issues or chronic medical problems, these are probably not your best option.  No Primary Care Doctor: Call Health Connect at  [phone] - they can help you locate a primary care doctor that  accepts your insurance, provides certain services, etc. Physician Referral [phone]  Emergency Department Resource Guide 1) Find a Doctor and Pay Out of Pocket Although you won't have to find out who is covered by your insurance plan, it is a good idea to ask around and get recommendations. You will then need to call the office and see if the doctor you have chosen will accept you as a new patient and what types of options they offer for patients who are self-pay. Some doctors offer discounts or will set up payment plans for their patients who do not have insurance, but you will need to ask so you aren't surprised when you get to your appointment.  2) Contact Your Local Health Department Not all health departments have doctors that can see patients for sick visits, but many do, so it is worth a call to see if yours does. If you don't know where your local health department is, you can check  in your phone book. The CDC also has a tool to help you locate your state's health department, and many state websites also have listings of all of their local health departments.  3) Find a Walk-in Clinic If your illness is not likely to be very severe or complicated, you may want to try a walk in clinic. These are popping up all over the country in pharmacies, drugstores, and shopping centers. They're usually staffed by nurse practitioners or physician assistants that have been trained to  treat common illnesses and complaints. They're usually fairly quick and inexpensive. However, if you have serious medical issues or chronic medical problems, these are probably not your best option.  No Primary Care Doctor: Call Health Connect at  [phone] - they can help you locate a primary care doctor that  accepts your insurance, provides certain services, etc. Physician Referral Service- [phone]  Chronic Pain Problems: Organization         Address  Phone   Notes  [facility]  [phone] Patients need to be referred by their primary care doctor.   Medication Assistance: Organization         Address  Phone   Notes  [facility] Medication [place] [address] [place] [place], [place] [address] [phone] --Must be a resident of [facility] -- Must have NO insurance coverage whatsoever (no Medicaid/ Medicare, etc.) -- The pt. MUST have a primary care doctor that directs their care regularly and follows them in the community   [facility]  [phone]   [employer]  [phone]    Agencies that provide inexpensive medical care: Organization         Address  Phone   Notes  [facility]  [phone]   [facility]    [phone]   [facility] [address] [place], [place] [address] [phone]   [facility] [address], [place] [phone]   [facility]    [phone]   [facility]    [phone]   Community Health and [facility]  [address], [place] Phone:  [phone], Fax:  [phone] Hours of Operation:  9 am - 6 pm, M-F.  Also accepts Medicaid/Medicare and self-pay.  [facility]  [address], [place] Phone: [phone], Fax: [phone]. Hours of Operation:  8:30 am - 5:30 pm, M-F.  Also accepts Medicaid and self-pay.  [facility]  [place] [address] [place] Phone: [phone]   [facility] [address] Natasha Bence [place], [place] [phone], Ext. [phone] Mondays & Thursdays: 7-9 AM.  First 15 patients are seen on a first come, first serve basis.    Medicaid-accepting [facility] Providers:  Organization         Address  Phone   Notes  [facility] [address], [place] [phone] Also accepts self-pay patients.  [facility] [address] Laurell Josephs [place], [place]  [phone]   [facility] [address], [place] [phone]   [facility] [address] [place] [phone]   Renaye Rakers [address], [place]   [phone] Only accepts [place] patients after they have their name applied to their card.  Self-Pay (no insurance) in [place]:  Organization         Address  Phone   Notes  Sickle Cell Patients, [facility] [address], [place] [phone]   [facility] [address], [place] [phone]) [phone]   [facility]  [address], [place] [phone]) [phone]   [facility]/Dr. Osei-Bonsu  [address] [place] [address], [place] or [phone], [place] [phone] Phone number for both [place] and [place] locations is the same.  Urgent [facility] and [facility] [address], [place] [phone]   [facility] [address] [place] [address], [place] or [address] [phone](336) [phone]   [facility] [address], [place] [phone](336) [phone], phone; [phone]) [phone], fax Sees patients 1st and 3rd Saturday of every month.  Must not qualify for public or private insurance (i.e. Medicaid, Medicare, [place] Health Choice, Veterans' Benefits)  Household income should be no more than 200% of  the poverty level The clinic cannot treat you if you are pregnant or think you are pregnant  Sexually transmitted diseases are not treated at the clinic.

## 2018-09-28 NOTE — ED Triage Notes (Signed)
Pt presents for evaluation of URI symptoms x 2-3 days with fever/chills, congestion, runny nose.

## 2020-11-19 ENCOUNTER — Inpatient Hospital Stay (HOSPITAL_COMMUNITY): Payer: Medicaid Other

## 2020-11-19 ENCOUNTER — Inpatient Hospital Stay (HOSPITAL_COMMUNITY)
Admission: AD | Admit: 2020-11-19 | Discharge: 2020-11-19 | Disposition: A | Discharge status: Home / Self Care | Payer: Medicaid Other | Attending: Family Medicine | Admitting: Family Medicine

## 2020-11-19 ENCOUNTER — Encounter (HOSPITAL_COMMUNITY): Payer: Self-pay | Admitting: Family Medicine

## 2020-11-19 ENCOUNTER — Other Ambulatory Visit: Payer: Self-pay

## 2020-11-19 DIAGNOSIS — Z886 Allergy status to analgesic agent status: Secondary | ICD-10-CM | POA: Insufficient documentation

## 2020-11-19 DIAGNOSIS — O200 Threatened abortion: Secondary | ICD-10-CM | POA: Diagnosis not present

## 2020-11-19 DIAGNOSIS — Z3A01 Less than 8 weeks gestation of pregnancy: Secondary | ICD-10-CM | POA: Diagnosis not present

## 2020-11-19 DIAGNOSIS — R109 Unspecified abdominal pain: Secondary | ICD-10-CM

## 2020-11-19 DIAGNOSIS — Z79899 Other long term (current) drug therapy: Secondary | ICD-10-CM | POA: Diagnosis not present

## 2020-11-19 DIAGNOSIS — Z3A08 8 weeks gestation of pregnancy: Secondary | ICD-10-CM | POA: Insufficient documentation

## 2020-11-19 DIAGNOSIS — O209 Hemorrhage in early pregnancy, unspecified: Secondary | ICD-10-CM | POA: Diagnosis not present

## 2020-11-19 LAB — CBC
HCT: 41 % (ref 36.0–46.0)
Hemoglobin: 13.1 g/dL (ref 12.0–15.0)
MCH: 26.4 pg (ref 26.0–34.0)
MCHC: 32 g/dL (ref 30.0–36.0)
MCV: 82.7 fL (ref 80.0–100.0)
Platelets: 295 10*3/uL (ref 150–400)
RBC: 4.96 MIL/uL (ref 3.87–5.11)
RDW: 13.1 % (ref 11.5–15.5)
WBC: 9.5 10*3/uL (ref 4.0–10.5)
nRBC: 0 % (ref 0.0–0.2)

## 2020-11-19 LAB — WET PREP, GENITAL
Clue Cells Wet Prep HPF POC: NONE SEEN
Sperm: NONE SEEN
Trich, Wet Prep: NONE SEEN
Yeast Wet Prep HPF POC: NONE SEEN

## 2020-11-19 LAB — URINALYSIS, ROUTINE W REFLEX MICROSCOPIC
Bacteria, UA: NONE SEEN
Bilirubin Urine: NEGATIVE
Glucose, UA: NEGATIVE mg/dL
Ketones, ur: NEGATIVE mg/dL
Nitrite: NEGATIVE
Protein, ur: 100 mg/dL — AB
RBC / HPF: >50 RBC/hpf — ABNORMAL HIGH (ref 0–5)
Specific Gravity, Urine: 1.018 (ref 1.005–1.030)
WBC, UA: >50 WBC/hpf — ABNORMAL HIGH (ref 0–5)
pH: 6 (ref 5.0–8.0)

## 2020-11-19 LAB — COMPREHENSIVE METABOLIC PANEL
ALT: 10 U/L (ref 0–44)
AST: 15 U/L (ref 15–41)
Albumin: 3.7 g/dL (ref 3.5–5.0)
Alkaline Phosphatase: 51 U/L (ref 38–126)
Anion gap: 11 (ref 5–15)
BUN: <5 mg/dL — ABNORMAL LOW (ref 6–20)
CO2: 22 mmol/L (ref 22–32)
Calcium: 9.2 mg/dL (ref 8.9–10.3)
Chloride: 102 mmol/L (ref 98–111)
Creatinine, Ser: 0.63 mg/dL (ref 0.44–1.00)
GFR, Estimated: >60 mL/min (ref >60)
Glucose, Bld: 95 mg/dL (ref 70–99)
Potassium: 4.1 mmol/L (ref 3.5–5.1)
Sodium: 135 mmol/L (ref 135–145)
Total Bilirubin: 0.6 mg/dL (ref 0.3–1.2)
Total Protein: 7.2 g/dL (ref 6.5–8.1)

## 2020-11-19 LAB — HCG, QUANTITATIVE, PREGNANCY: hCG, Beta Chain, Quant, S: 7169 m[IU]/mL — ABNORMAL HIGH (ref <5)

## 2020-11-19 LAB — POCT PREGNANCY, URINE: Preg Test, Ur: POSITIVE — AB

## 2020-11-19 MED ORDER — ONDANSETRON 8 MG PO TBDP: 8.0000 mg | ORAL_TABLET | Freq: Three times a day (TID) | ORAL | 0 refills | Status: DC | PRN

## 2020-11-19 NOTE — MAU Note (Signed)
Pt reports to mau with c/o vag bleeding for a few weeks that has increased over the last several days.  Pt reports pos hpt end of December.  Pt denies abd pain.

## 2020-11-19 NOTE — Discharge Instructions (Signed)
-  return to MAU if fever, extreme pain, foul-smelling discharge -If soaking more than 2 pads in two hours; dizzy, lightheaded -wait for phone call about [place] appointment in two weeks.   -zofran can cause constipation; eat fiber, increase water

## 2020-11-19 NOTE — MAU Provider Note (Signed)
Patient Diane Sanders is a 35 y.o.  [phone] At [redacted]w[redacted]d here with complaints of vaginal bleeding and lower back pain; she denies abdominal pain. She denies fever, pain with urination, SOB, chest pain. She came in because the bleeding has gotten worse over the past week and she is concerned that she is miscarrying. She denies abnormal discharge.  History     CSN: [id number]  Arrival date and time: 11/19/20 1127   None     Chief Complaint  Patient presents with  . Vaginal Bleeding   Vaginal Bleeding The patient's primary symptoms include vaginal bleeding. This is a chronic problem. The current episode started 1 to 4 weeks ago. The problem has been gradually worsening. Pertinent negatives include no constipation, diarrhea, urgency or vomiting. The vaginal discharge was bloody. Vaginal bleeding amount: like the early days of starting your period. She has not been passing clots. She has not been passing tissue.    OB History    Gravida  8   Para  4   Term  3   Preterm  1   AB  3   Living  4     SAB  1   IAB  2   Ectopic      Multiple      Live Births              No past medical history on file.  Past Surgical History:  Procedure Laterality Date  . ABSCESS DRAINAGE     bilateral due to clogged milk ducts    Family History  Problem Relation Age of Onset  . Healthy Mother     Social History   Tobacco Use  . Smoking status: Never Smoker  . Smokeless tobacco: Never Used  Substance Use Topics  . Alcohol use: No  . Drug use: No    Allergies:  Allergies  Allergen Reactions  . Ibuprofen Rash    Higher doses make patient itch and break out    Medications Prior to Admission  Medication Sig Dispense Refill Last Dose  . benzonatate (TESSALON) 100 MG capsule Take 1 capsule (100 mg total) by mouth every 8 (eight) hours. 21 capsule 0   . ondansetron (ZOFRAN ODT) 8 MG disintegrating tablet Take 1 tablet (8 mg total) by mouth every 8 (eight) hours as needed for  nausea or vomiting. 20 tablet 0   . promethazine (PHENERGAN) 25 MG tablet Take 1 tablet (25 mg total) by mouth every 6 (six) hours as needed for nausea or vomiting. 30 tablet 0   . sodium chloride (OCEAN) 0.65 % SOLN nasal spray Place 1 spray into both nostrils as needed for congestion. 1 Bottle 0     Review of Systems  Constitutional: Negative.   HENT: Negative.   Respiratory: Negative.   Cardiovascular: Negative.   Gastrointestinal: Negative for constipation, diarrhea and vomiting.  Genitourinary: Positive for vaginal bleeding. Negative for urgency.  Neurological: Negative.   Psychiatric/Behavioral: Negative.    Physical Exam   Blood pressure (!) 115/59, pulse 89, temperature 98.4 F (36.9 C), temperature source Oral, resp. rate 16, last menstrual period 09/24/2020, SpO2 100 %, unknown if currently breastfeeding.  Physical Exam Constitutional:      Appearance: Normal appearance.  Cardiovascular:     Rate and Rhythm: Normal rate.  Pulmonary:     Effort: Pulmonary effort is normal.  Neurological:     Mental Status: She is alert.     MAU Course  Procedures  MDM -full ectopic  work-up done; US shows irregularly shaped embryo at 6 weeks 3 days; uncertain viability. I have independently reviewed the [place] images, which reveal finding of threatened miscarriage.  -beta is 7000 -CBC, CMP, ABO, Type and screen   Assessment and Plan   1. Threatened miscarriage   2. Abdominal pain   -explained to patient that she most likely has a threatened miscarriage; patient is understanding and accepting of the situation.  -[place] for viability scan in 11 days; order is placed.  -Bleeding precautions reviewed -Rx for Zofran as patient is reporting nausea    Charlesetta Garibaldi Adelynne Joerger 11/19/2020, 2:01 PM

## 2020-11-20 LAB — GC/CHLAMYDIA PROBE AMP (~~LOC~~) NOT AT ARMC
Chlamydia: NEGATIVE
Comment: NEGATIVE
Comment: NORMAL
Neisseria Gonorrhea: NEGATIVE

## 2020-12-05 ENCOUNTER — Ambulatory Visit: Admission: RE | Admit: 2020-12-05 | Payer: Medicaid Other | Source: Ambulatory Visit

## 2021-06-09 ENCOUNTER — Ambulatory Visit (HOSPITAL_COMMUNITY)
Admission: EM | Admit: 2021-06-09 | Discharge: 2021-06-09 | Disposition: A | Discharge status: Home / Self Care | Payer: Medicaid Other | Attending: Student | Admitting: Student

## 2021-06-09 ENCOUNTER — Encounter (HOSPITAL_COMMUNITY): Payer: Self-pay | Admitting: Emergency Medicine

## 2021-06-09 ENCOUNTER — Other Ambulatory Visit: Payer: Self-pay

## 2021-06-09 DIAGNOSIS — B373 Candidiasis of vulva and vagina: Secondary | ICD-10-CM | POA: Diagnosis not present

## 2021-06-09 DIAGNOSIS — B3731 Acute candidiasis of vulva and vagina: Secondary | ICD-10-CM

## 2021-06-09 MED ORDER — FLUCONAZOLE 150 MG PO TABS: 150.0000 mg | ORAL_TABLET | Freq: Every day | ORAL | 0 refills | Status: AC

## 2021-06-09 NOTE — ED Triage Notes (Signed)
Onset 2 days ago of symptoms.  Patient reports a slight itch.  No vaginal discharge

## 2021-06-09 NOTE — ED Provider Notes (Signed)
[facility]    CSN: [id number] Arrival date & time: 06/09/21  1021      History   Chief Complaint Chief Complaint  Patient presents with   Vaginal Itching    HPI Diane Sanders is a 35 y.o. female presenting with two days of vaginal itching- states symptoms ar consistent with past yeast. Denies recent abx, new partners, change in soap/detergent, etc. Denies hematuria, dysuria, frequency, urgency, back pain, n/v/d/abd pain, fevers/chills, abdnormal vaginal rashes/lesions. Injection for contraception.    HPI  History reviewed. No pertinent past medical history.  There are no problems to display for this patient.   Past Surgical History:  Procedure Laterality Date   ABSCESS DRAINAGE     bilateral due to clogged milk ducts    OB History     Gravida  8   Para  4   Term  3   Preterm  1   AB  3   Living  4      SAB  1   IAB  2   Ectopic      Multiple      Live Births               Home Medications    Prior to Admission medications   Medication Sig Start Date End Date Taking? Authorizing Provider  fluconazole (DIFLUCAN) 150 MG tablet Take 1 tablet (150 mg total) by mouth daily. -For your yeast infection, start the Diflucan (fluconazole)- Take one pill today (day 1). If you're still having symptoms in 3 days, take the second pill. 06/09/21  Yes Rhys Martini, PA-C    Family History Family History  Problem Relation Age of Onset   Healthy Mother     Social History Social History   Tobacco Use   Smoking status: Never   Smokeless tobacco: Never  Vaping Use   Vaping Use: Never used  Substance Use Topics   Alcohol use: Yes   Drug use: No     Allergies   Ibuprofen   Review of Systems Review of Systems  Constitutional:  Negative for chills and fever.  HENT:  Negative for sore throat.   Eyes:  Negative for pain and redness.  Respiratory:  Negative for shortness of breath.   Cardiovascular:  Negative for chest pain.   Gastrointestinal:  Negative for abdominal pain, diarrhea, nausea and vomiting.  Genitourinary:  Negative for decreased urine volume, difficulty urinating, dysuria, flank pain, frequency, genital sores, hematuria, menstrual problem, pelvic pain, urgency, vaginal bleeding, vaginal discharge and vaginal pain.  Musculoskeletal:  Negative for back pain.  Skin:  Negative for rash.    Physical Exam Triage Vital Signs ED Triage Vitals  Enc Vitals Group     BP 06/09/21 1157 (!) 111/59     Pulse Rate 06/09/21 1157 79     Resp 06/09/21 1157 18     Temp 06/09/21 1157 98.5 F (36.9 C)     Temp Source 06/09/21 1157 Oral     SpO2 06/09/21 1157 100 %     Weight --      Height --      Head Circumference --      Peak Flow --      Pain Score 06/09/21 1154 0     Pain Loc --      Pain Edu? --      Excl. in GC? --    No data found.  Updated Vital Signs BP (!) 111/59 (BP Location: Right  Arm)   Pulse 79   Temp 98.5 F (36.9 C) (Oral)   Resp 18   LMP 09/24/2020   SpO2 100%   Breastfeeding Unknown   Visual Acuity Right Eye Distance:   Left Eye Distance:   Bilateral Distance:    Right Eye Near:   Left Eye Near:    Bilateral Near:     Physical Exam Vitals reviewed.  Constitutional:      General: She is not in acute distress.    Appearance: Normal appearance. She is not ill-appearing.  HENT:     Head: Normocephalic and atraumatic.     Mouth/Throat:     Mouth: Mucous membranes are moist.     Comments: Moist mucous membranes Eyes:     Extraocular Movements: Extraocular movements intact.     Pupils: Pupils are equal, round, and reactive to light.  Cardiovascular:     Rate and Rhythm: Normal rate and regular rhythm.     Heart sounds: Normal heart sounds.  Pulmonary:     Effort: Pulmonary effort is normal.     Breath sounds: Normal breath sounds. No wheezing, rhonchi or rales.  Abdominal:     General: Bowel sounds are normal. There is no distension.     Palpations: Abdomen is  soft. There is no mass.     Tenderness: There is no abdominal tenderness. There is no right CVA tenderness, left CVA tenderness, guarding or rebound.  Genitourinary:    Comments: deferred Skin:    General: Skin is warm.     Capillary Refill: Capillary refill takes less than 2 seconds.     Comments: Good skin turgor  Neurological:     General: No focal deficit present.     Mental Status: She is alert and oriented to person, place, and time.  Psychiatric:        Mood and Affect: Mood normal.        Behavior: Behavior normal.     UC Treatments / Results  Labs (all labs ordered are listed, but only abnormal results are displayed) Labs Reviewed  CERVICOVAGINAL ANCILLARY ONLY    EKG   Radiology No results found.  Procedures Procedures (including critical care time)  Medications Ordered in UC Medications - No data to display  Initial Impression / Assessment and Plan / UC Course  I have reviewed the triage vital signs and the nursing notes.  Pertinent labs & imaging results that were available during my care of the patient were reviewed by me and considered in my medical decision making (see chart for details).     This patient is a very pleasant 35 y.o. year old female presenting with suspected vaginal candidiasis. Afebrile, nontachy, no abd pain or CVAT.   Last G/C negative 10/2020. Denies STI risk today.   Will send self-swab for G/C, trich, yeast, BV testing.  Injection for contraception  Will treat for presumed yeast with diflucan as below.  ED return precautions discussed. Patient verbalizes understanding and agreement.   Level 4- review of past notes and labs, order and interpretation of labs today, and prescription drug management.  Final Clinical Impressions(s) / UC Diagnoses   Final diagnoses:  Vaginal candidiasis     Discharge Instructions      -For your yeast infection, start the Diflucan (fluconazole)- Take one pill today (day 1). If you're still  having symptoms in 3 days, take the second pill.  -We are confirming the diagnosis of yeast infection with labwork. Labs will result in 2-3  days, you will be called if positive for anything other than yeast.  -Abstain from intercourse until all medication complete and symptoms have resolved. Make sure to only cleanse the external vaginal area with mild unscented soap and water. Try wearing breathable cotton underwear. Urinate after every sexual encounter.        ED Prescriptions     Medication Sig Dispense Auth. Provider   fluconazole (DIFLUCAN) 150 MG tablet Take 1 tablet (150 mg total) by mouth daily. -For your yeast infection, start the Diflucan (fluconazole)- Take one pill today (day 1). If you're still having symptoms in 3 days, take the second pill. 2 tablet Rhys Martini, PA-C      PDMP not reviewed this encounter.   Rhys Martini, PA-C 06/09/21 1232

## 2021-06-09 NOTE — Discharge Instructions (Addendum)
-  For your yeast infection, start the Diflucan (fluconazole)- Take one pill today (day 1). If you're still having symptoms in 3 days, take the second pill.  -We are confirming the diagnosis of yeast infection with labwork. Labs will result in 2-3 days, you will be called if positive for anything other than yeast.  -Abstain from intercourse until all medication complete and symptoms have resolved. Make sure to only cleanse the external vaginal area with mild unscented soap and water. Try wearing breathable cotton underwear. Urinate after every sexual encounter.

## 2021-06-11 LAB — CERVICOVAGINAL ANCILLARY ONLY
Bacterial Vaginitis (gardnerella): NEGATIVE
Candida Glabrata: NEGATIVE
Candida Vaginitis: NEGATIVE
Chlamydia: NEGATIVE
Comment: NEGATIVE
Comment: NEGATIVE
Comment: NEGATIVE
Comment: NEGATIVE
Comment: NEGATIVE
Comment: NORMAL
Neisseria Gonorrhea: NEGATIVE
Trichomonas: NEGATIVE

## 2022-02-02 IMAGING — US US OB COMP LESS 14 WK
1 series · 15 of 28 positions shown · non-contrast
Comparison: None.

CLINICAL DATA: Pregnant, vaginal bleeding

EXAM:
OBSTETRIC <14 WK US AND TRANSVAGINAL OB US
TECHNIQUE: Both transabdominal and transvaginal ultrasound examinations were
performed for complete evaluation of the gestation as well as the
maternal uterus, adnexal regions, and pelvic cul-de-sac.
Transvaginal technique was performed to assess early pregnancy.

[Series 1: us ob comp less 14 wk · 58 acquisitions, 15 frames shown]
[im 1/58]
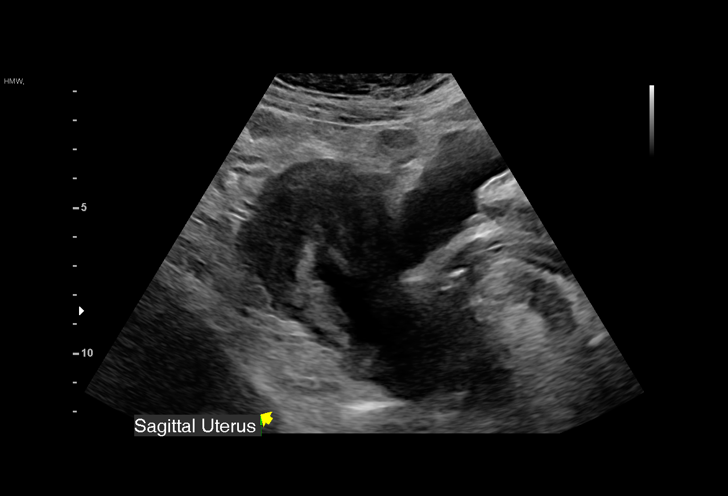
[im 5/58]
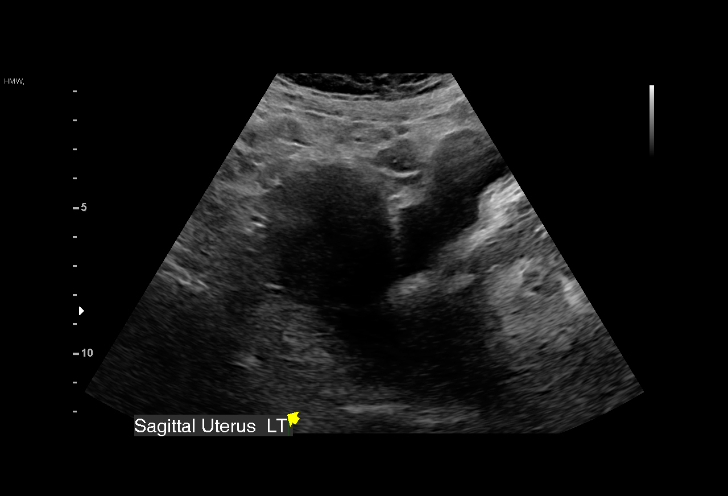
[im 9/58]
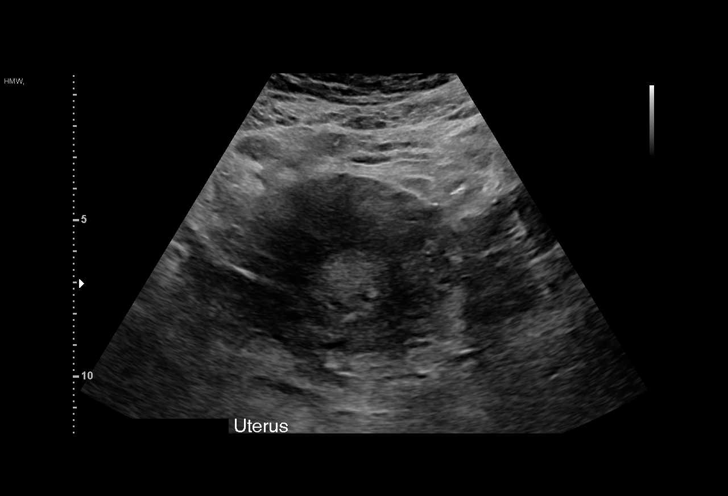
[im 13/58]
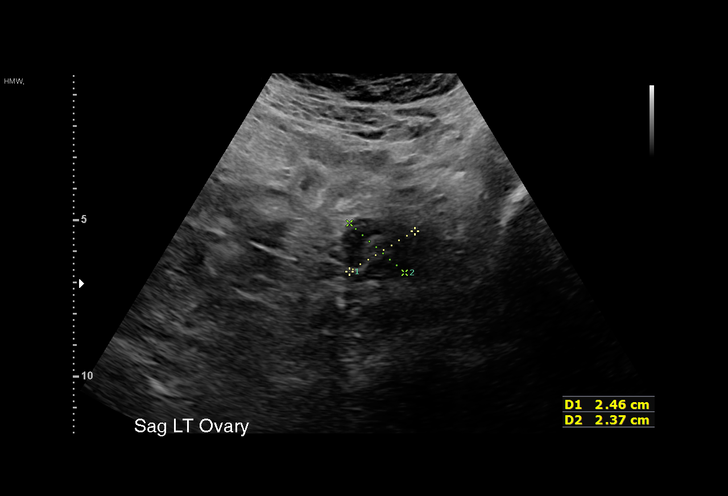
[im 17/58]
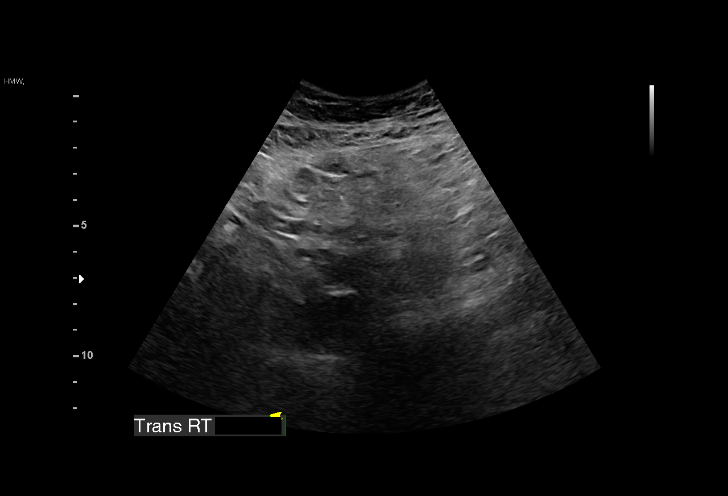
[im 22/58]
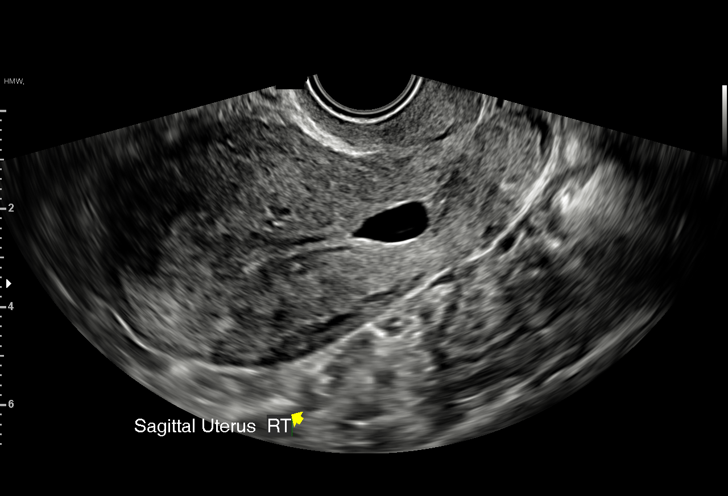
[im 26/58]
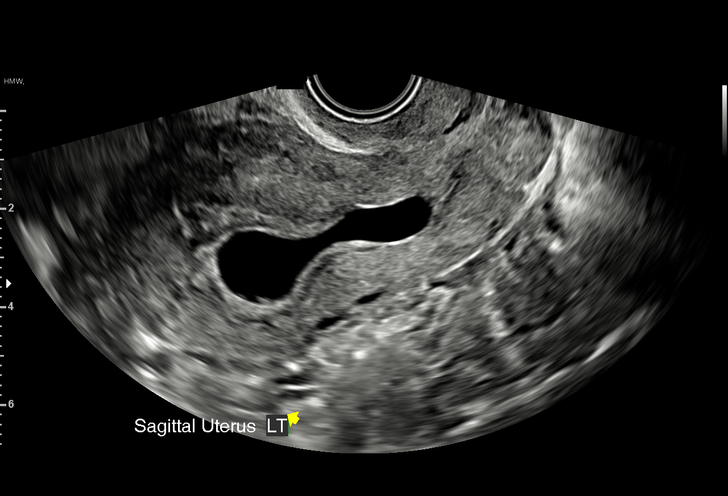
[im 30/58]
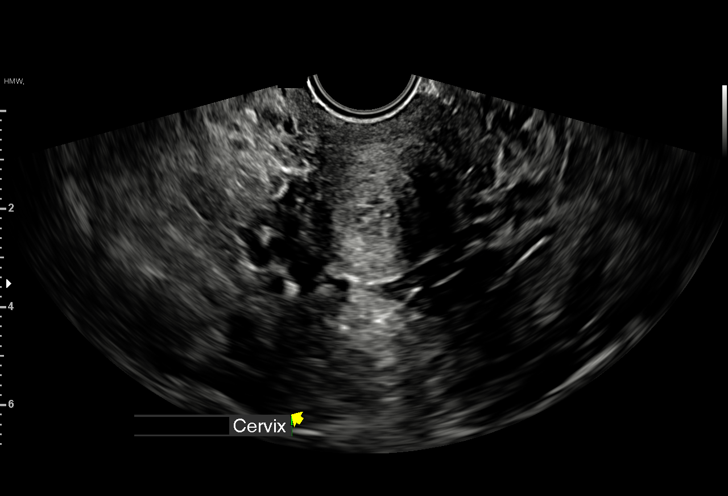
[im 32/58]
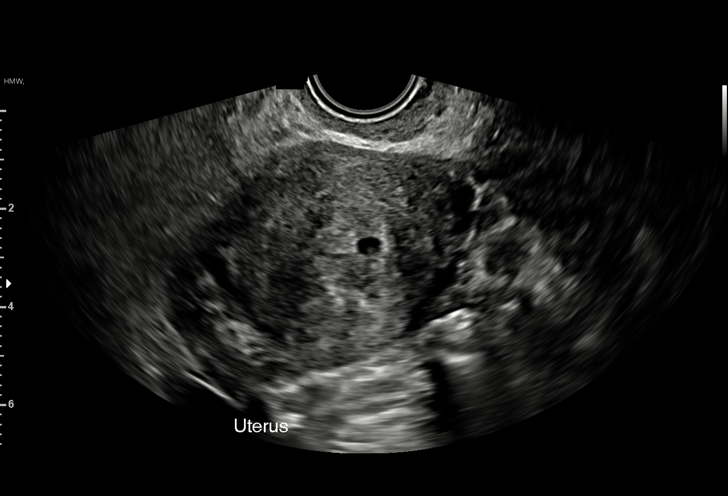
[im 36/58]
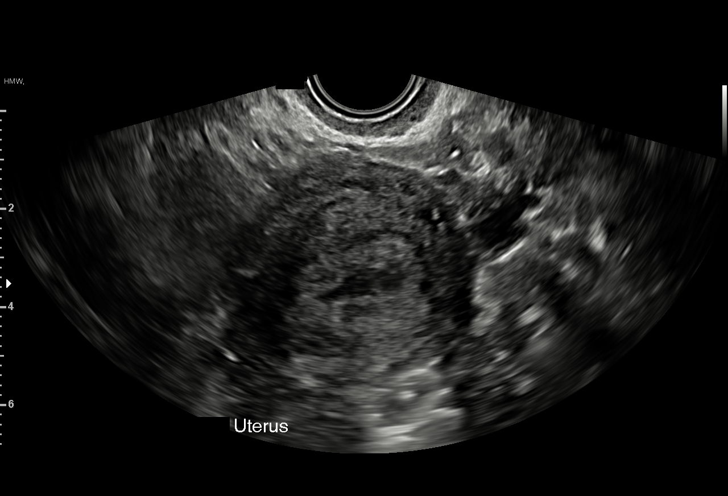
[im 41/58]
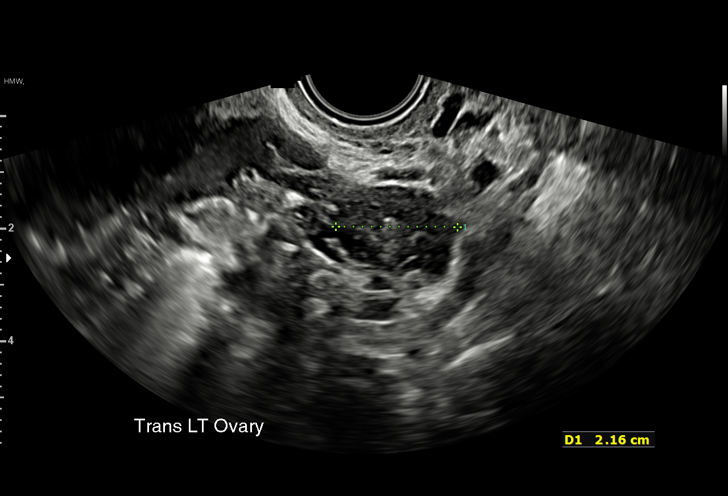
[im 45/58]
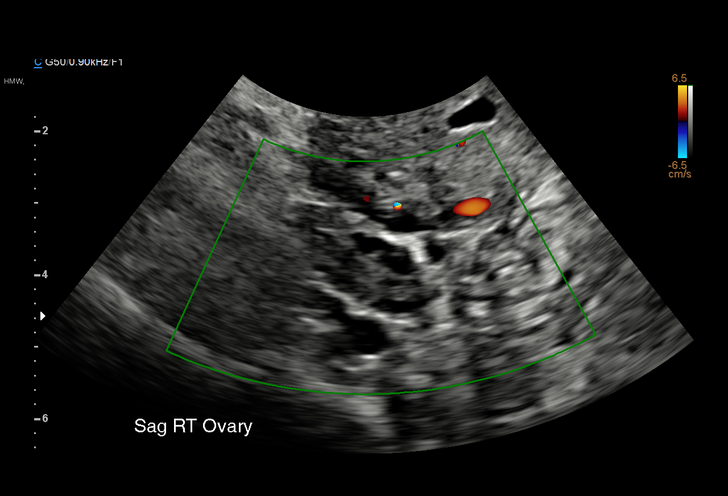
[im 49/58]
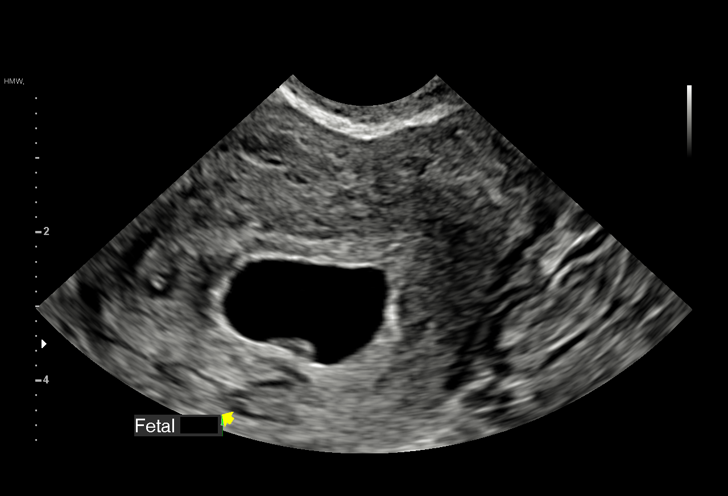
[im 53/58]
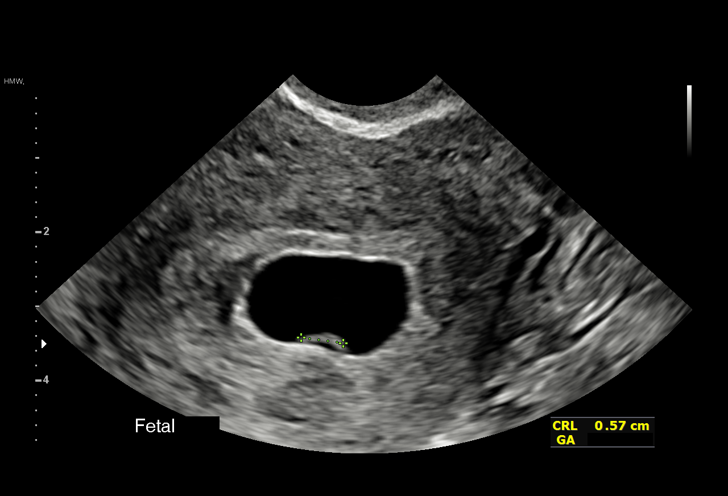
[im 58/58]
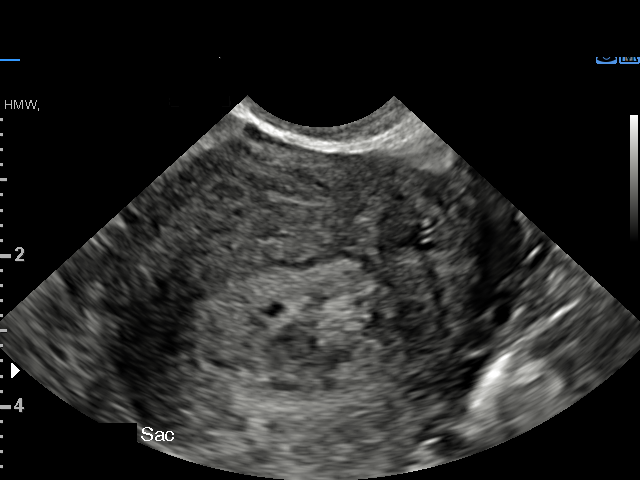

[15 of 28 positions shown; findings below may reference images not displayed]

FINDINGS: Intrauterine gestational sac: Single, irregular/elongated appearance

Yolk sac:  Visualized.

Embryo:  Visualized.

Cardiac Activity: Not Visualized.

Heart Rate: 0  bpm

CRL:  6.1 mm   6 w   3 d

Subchorionic hemorrhage:  None visualized.

Maternal uterus/adnexae: Bilateral ovaries are within normal limits.

No free fluid.
IMPRESSION: Single intrauterine gestation without cardiac activity, as above.

Findings are suspicious but not yet definitive for failed pregnancy.
Recommend follow-up US in 10-14 days for definitive diagnosis. This
recommendation follows SRU consensus guidelines: Diagnostic Criteria
for Nonviable Pregnancy Early in the First Trimester. N Engl J Med

## 2022-09-14 DIAGNOSIS — N390 Urinary tract infection, site not specified: Secondary | ICD-10-CM | POA: Diagnosis not present

## 2022-09-14 DIAGNOSIS — R350 Frequency of micturition: Secondary | ICD-10-CM | POA: Diagnosis not present

## 2022-09-14 DIAGNOSIS — Z3202 Encounter for pregnancy test, result negative: Secondary | ICD-10-CM | POA: Diagnosis not present

## 2024-03-16 ENCOUNTER — Other Ambulatory Visit: Payer: Self-pay

## 2024-03-16 ENCOUNTER — Emergency Department (HOSPITAL_BASED_OUTPATIENT_CLINIC_OR_DEPARTMENT_OTHER): Admission: EM | Admit: 2024-03-16 | Discharge: 2024-03-16 | Disposition: A | Discharge status: Home / Self Care

## 2024-03-16 ENCOUNTER — Encounter (HOSPITAL_BASED_OUTPATIENT_CLINIC_OR_DEPARTMENT_OTHER): Payer: Self-pay

## 2024-03-16 DIAGNOSIS — M79662 Pain in left lower leg: Secondary | ICD-10-CM | POA: Insufficient documentation

## 2024-03-16 DIAGNOSIS — M546 Pain in thoracic spine: Secondary | ICD-10-CM | POA: Insufficient documentation

## 2024-03-16 DIAGNOSIS — M545 Low back pain, unspecified: Secondary | ICD-10-CM | POA: Diagnosis present

## 2024-03-16 DIAGNOSIS — Y92410 Unspecified street and highway as the place of occurrence of the external cause: Secondary | ICD-10-CM | POA: Insufficient documentation

## 2024-03-16 DIAGNOSIS — M7918 Myalgia, other site: Secondary | ICD-10-CM

## 2024-03-16 MED ORDER — LIDOCAINE 5 % EX PTCH
2.0000 | MEDICATED_PATCH | CUTANEOUS | Status: DC
  Administered 2024-03-16: 2 via TRANSDERMAL
  Filled 2024-03-16: qty 2

## 2024-03-16 MED ORDER — METHOCARBAMOL 500 MG PO TABS: 500.0000 mg | ORAL_TABLET | Freq: Two times a day (BID) | ORAL | 0 refills | Status: AC

## 2024-03-16 MED ORDER — LIDOCAINE 5 % EX PTCH
1.0000 | MEDICATED_PATCH | CUTANEOUS | Status: DC
  Administered 2024-03-16: 1 via TRANSDERMAL
  Filled 2024-03-16: qty 1

## 2024-03-16 NOTE — Discharge Instructions (Signed)
 Tylenol  as needed as directed for pain.  Take Robaxin as needed as prescribed for muscle spasms.  Do not take this medication if driving or operating machinery.  You can apply warm compresses to sore muscles for 20 minutes at a time and follow with gentle range of motion exercises.  Follow-up with primary care provider or see [facility] for pain not improving after 3 to 5 days.
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
## 2024-03-16 NOTE — ED Notes (Signed)
 Discharge instructions reviewed with patient. Patient verbalizes understanding, no further questions at this time. Medications/prescriptions and follow up information provided. No acute distress noted at time of departure.
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
